# Patient Record
Sex: Female | Born: 1950 | Race: Black or African American | Hispanic: No | Smoking: Former smoker
Health system: Southern US, Community
[De-identification: ages and names within clinical notes are randomized; demographics above are authoritative.]

## PROBLEM LIST (undated history)

## (undated) DIAGNOSIS — E785 Hyperlipidemia, unspecified: Secondary | ICD-10-CM

## (undated) DIAGNOSIS — H409 Unspecified glaucoma: Secondary | ICD-10-CM

## (undated) DIAGNOSIS — I10 Essential (primary) hypertension: Secondary | ICD-10-CM

## (undated) HISTORY — DX: Essential (primary) hypertension: I10

## (undated) HISTORY — DX: Hyperlipidemia, unspecified: E78.5

## (undated) HISTORY — DX: Unspecified glaucoma: H40.9

## (undated) HISTORY — PX: ABDOMINAL HYSTERECTOMY: SHX81

---

## 1999-07-17 ENCOUNTER — Other Ambulatory Visit: Admission: RE | Admit: 1999-07-17 | Discharge: 1999-07-17 | Payer: Self-pay | Admitting: Obstetrics and Gynecology

## 2000-07-01 ENCOUNTER — Other Ambulatory Visit: Admission: RE | Admit: 2000-07-01 | Discharge: 2000-07-01 | Payer: Self-pay | Admitting: Obstetrics and Gynecology

## 2000-07-04 ENCOUNTER — Encounter: Payer: Self-pay | Admitting: Obstetrics and Gynecology

## 2000-07-04 ENCOUNTER — Encounter: Admission: RE | Admit: 2000-07-04 | Discharge: 2000-07-04 | Payer: Self-pay | Admitting: Obstetrics and Gynecology

## 2001-07-07 ENCOUNTER — Encounter: Payer: Self-pay | Admitting: Obstetrics and Gynecology

## 2001-07-07 ENCOUNTER — Encounter: Admission: RE | Admit: 2001-07-07 | Discharge: 2001-07-07 | Payer: Self-pay | Admitting: Obstetrics and Gynecology

## 2001-07-16 ENCOUNTER — Other Ambulatory Visit: Admission: RE | Admit: 2001-07-16 | Discharge: 2001-07-16 | Payer: Self-pay | Admitting: Internal Medicine

## 2002-07-09 ENCOUNTER — Encounter: Admission: RE | Admit: 2002-07-09 | Discharge: 2002-07-09 | Payer: Self-pay | Admitting: Obstetrics and Gynecology

## 2002-07-09 ENCOUNTER — Encounter: Payer: Self-pay | Admitting: Obstetrics and Gynecology

## 2002-07-14 ENCOUNTER — Other Ambulatory Visit: Admission: RE | Admit: 2002-07-14 | Discharge: 2002-07-14 | Payer: Self-pay | Admitting: Obstetrics and Gynecology

## 2002-07-24 ENCOUNTER — Encounter: Admission: RE | Admit: 2002-07-24 | Discharge: 2002-07-24 | Payer: Self-pay | Admitting: Obstetrics and Gynecology

## 2002-07-24 ENCOUNTER — Encounter: Payer: Self-pay | Admitting: Obstetrics and Gynecology

## 2003-11-22 ENCOUNTER — Encounter: Admission: RE | Admit: 2003-11-22 | Discharge: 2003-11-22 | Payer: Self-pay | Admitting: Obstetrics and Gynecology

## 2004-11-19 HISTORY — PX: COLONOSCOPY: SHX174

## 2004-11-23 ENCOUNTER — Ambulatory Visit (HOSPITAL_COMMUNITY): Admission: RE | Admit: 2004-11-23 | Discharge: 2004-11-23 | Payer: Self-pay | Admitting: Obstetrics and Gynecology

## 2005-01-01 ENCOUNTER — Other Ambulatory Visit: Admission: RE | Admit: 2005-01-01 | Discharge: 2005-01-01 | Payer: Self-pay | Admitting: Obstetrics and Gynecology

## 2005-03-07 ENCOUNTER — Ambulatory Visit (HOSPITAL_COMMUNITY): Admission: RE | Admit: 2005-03-07 | Discharge: 2005-03-07 | Payer: Self-pay | Admitting: Internal Medicine

## 2005-03-07 ENCOUNTER — Ambulatory Visit: Payer: Self-pay | Admitting: Internal Medicine

## 2005-06-19 ENCOUNTER — Emergency Department (HOSPITAL_COMMUNITY): Admission: EM | Admit: 2005-06-19 | Discharge: 2005-06-19 | Payer: Self-pay | Admitting: Emergency Medicine

## 2005-11-27 ENCOUNTER — Ambulatory Visit (HOSPITAL_COMMUNITY): Admission: RE | Admit: 2005-11-27 | Discharge: 2005-11-27 | Payer: Self-pay | Admitting: Obstetrics and Gynecology

## 2006-11-28 ENCOUNTER — Ambulatory Visit (HOSPITAL_COMMUNITY): Admission: RE | Admit: 2006-11-28 | Discharge: 2006-11-28 | Payer: Self-pay | Admitting: Obstetrics and Gynecology

## 2007-12-01 ENCOUNTER — Ambulatory Visit (HOSPITAL_COMMUNITY): Admission: RE | Admit: 2007-12-01 | Discharge: 2007-12-01 | Payer: Self-pay | Admitting: Obstetrics and Gynecology

## 2008-12-14 ENCOUNTER — Ambulatory Visit (HOSPITAL_COMMUNITY): Admission: RE | Admit: 2008-12-14 | Discharge: 2008-12-14 | Payer: Self-pay | Admitting: Obstetrics and Gynecology

## 2009-12-27 ENCOUNTER — Ambulatory Visit (HOSPITAL_COMMUNITY): Admission: RE | Admit: 2009-12-27 | Discharge: 2009-12-27 | Payer: Self-pay | Admitting: Obstetrics and Gynecology

## 2010-12-08 ENCOUNTER — Other Ambulatory Visit (HOSPITAL_COMMUNITY): Payer: Self-pay | Admitting: Obstetrics and Gynecology

## 2010-12-08 DIAGNOSIS — Z1239 Encounter for other screening for malignant neoplasm of breast: Secondary | ICD-10-CM

## 2010-12-29 ENCOUNTER — Other Ambulatory Visit (HOSPITAL_BASED_OUTPATIENT_CLINIC_OR_DEPARTMENT_OTHER): Payer: Self-pay | Admitting: Family Medicine

## 2010-12-29 ENCOUNTER — Ambulatory Visit (HOSPITAL_COMMUNITY)
Admission: RE | Admit: 2010-12-29 | Discharge: 2010-12-29 | Disposition: A | Discharge status: Home / Self Care | Payer: PRIVATE HEALTH INSURANCE | Source: Ambulatory Visit | Attending: Obstetrics and Gynecology | Admitting: Obstetrics and Gynecology

## 2010-12-29 DIAGNOSIS — Z139 Encounter for screening, unspecified: Secondary | ICD-10-CM

## 2010-12-29 DIAGNOSIS — Z1239 Encounter for other screening for malignant neoplasm of breast: Secondary | ICD-10-CM

## 2011-01-05 ENCOUNTER — Ambulatory Visit (HOSPITAL_COMMUNITY)
Admission: RE | Admit: 2011-01-05 | Discharge: 2011-01-05 | Disposition: A | Discharge status: Home / Self Care | Payer: PRIVATE HEALTH INSURANCE | Source: Ambulatory Visit | Attending: Family Medicine | Admitting: Family Medicine

## 2011-01-05 DIAGNOSIS — Z139 Encounter for screening, unspecified: Secondary | ICD-10-CM

## 2011-01-05 DIAGNOSIS — Z1231 Encounter for screening mammogram for malignant neoplasm of breast: Secondary | ICD-10-CM | POA: Insufficient documentation

## 2011-04-06 NOTE — Op Note (Signed)
NAMECLEMENCE, Alicia Wu                 ACCOUNT NO.:  [id number]   MEDICAL RECORD NO.:  [id number]          PATIENT TYPE:  AMB   LOCATION:  DAY                           FACILITY:  [facility]   PHYSICIAN:  R. Roetta Sessions, M.D. DATE OF BIRTH:  10-25-1951   DATE OF PROCEDURE:  03/07/2005  DATE OF DISCHARGE:                                 OPERATIVE REPORT   PROCEDURE:  Screening colonoscopy.   INDICATIONS FOR PROCEDURE:  The patient is a 60 year old sent over at the  courtesy of Dr. Lilyan Punt for colorectal cancer screening. She has no  lower GI tract symptoms. There is no family history of colorectal neoplasia.  She has never had a colonoscopy. Colonoscopy is now being done as a standard  screening maneuver. This approach has been discussed with the patient.  Potential risks, benefits, and alternatives have been reviewed and questions  answered. She is agreeable. Please see documentation in medical record.   PROCEDURE NOTE:  O2 saturation, blood pressure, pulse, and respirations were  monitored throughout the entire procedure. Conscious sedation with Versed 3  mg IV and Demerol 50 mg IV in divided doses.   INSTRUMENT:  Olympus video chip system.   FINDINGS:  Digital exam revealed no abnormalities.   ENDOSCOPIC FINDINGS:  Prep was good.   Rectum:  Examination of the rectal mucosa including retroflexed view of the  anal verge revealed no abnormalities.   Colon:  Colonic mucosa was surveyed from the rectosigmoid junction through  the left, transverse, and right colon to the area of the appendiceal  orifice, ileocecal valve, and cecum. These structures were well seen and  photographed for the record. Olympus videoscope was slowly withdrawn. All  previously mentioned mucosal surfaces were again seen. The colon mucosa  appeared normal. The patient tolerated the procedure well and was reactive  to endoscopy.   IMPRESSION:  Minimal internal hemorrhoids. Otherwise normal rectum, normal  colon.   RECOMMENDATIONS:  Repeat colonoscopy in 10 years.      RMR/MEDQ  D:  03/07/2005  T:  03/07/2005  Job:  [id number]   cc:   Lorin Picket A. Gerda Diss, MD  [address], [address]  [place]  [place] [address]  Fax: [phone]

## 2011-12-04 ENCOUNTER — Other Ambulatory Visit (HOSPITAL_COMMUNITY): Payer: Self-pay | Admitting: Obstetrics and Gynecology

## 2011-12-04 DIAGNOSIS — Z139 Encounter for screening, unspecified: Secondary | ICD-10-CM

## 2012-01-07 ENCOUNTER — Ambulatory Visit (HOSPITAL_COMMUNITY)
Admission: RE | Admit: 2012-01-07 | Discharge: 2012-01-07 | Disposition: A | Discharge status: Home / Self Care | Payer: PRIVATE HEALTH INSURANCE | Source: Ambulatory Visit | Attending: Obstetrics and Gynecology | Admitting: Obstetrics and Gynecology

## 2012-01-07 DIAGNOSIS — Z1231 Encounter for screening mammogram for malignant neoplasm of breast: Secondary | ICD-10-CM | POA: Insufficient documentation

## 2012-01-07 DIAGNOSIS — Z139 Encounter for screening, unspecified: Secondary | ICD-10-CM

## 2013-04-15 ENCOUNTER — Encounter: Payer: Self-pay | Admitting: *Deleted

## 2013-04-21 ENCOUNTER — Other Ambulatory Visit: Payer: Self-pay | Admitting: Family Medicine

## 2013-04-21 ENCOUNTER — Ambulatory Visit (INDEPENDENT_AMBULATORY_CARE_PROVIDER_SITE_OTHER): Payer: BC Managed Care – PPO | Admitting: Family Medicine

## 2013-04-21 ENCOUNTER — Encounter: Payer: Self-pay | Admitting: Family Medicine

## 2013-04-21 VITALS — BP 140/88 | HR 80 | Wt 204.0 lb

## 2013-04-21 DIAGNOSIS — Z139 Encounter for screening, unspecified: Secondary | ICD-10-CM

## 2013-04-21 DIAGNOSIS — E785 Hyperlipidemia, unspecified: Secondary | ICD-10-CM

## 2013-04-21 DIAGNOSIS — I10 Essential (primary) hypertension: Secondary | ICD-10-CM

## 2013-04-21 MED ORDER — AMLODIPINE BESYLATE 5 MG PO TABS: 5.0000 mg | ORAL_TABLET | Freq: Every day | ORAL | Status: DC

## 2013-04-21 NOTE — Patient Instructions (Addendum)
Check your BP once a week,  Do your labs! And your mamogram.  Follow up here in 6 months  Do more walking and eat healthy!!

## 2013-04-21 NOTE — Progress Notes (Signed)
  Subjective:    Patient ID: Alicia Wu, female    DOB: Mar 05, 1951, 62 y.o.   MRN: [id number]  HPI pt here on 6 month f/u on hypertension. States BP is good, fluctuates off and on.   This patient today she is trying to exercise on regular basis but she relates over the past several months she has not in addition to this she does try to keep healthy she takes her medicine. She denies any chest pressure shortness of breath nausea vomiting diarrhea fever chills past medical history hypertension family history noncontributory social doesn't smoke   Review of Systems See above    Objective:   Physical Exam Blood pressure recheck 138/80 neck no masses lungs are clear no crackles heart is regular pulses normal extremities no edema skin is warm and dry       Assessment & Plan:  HTN-good control continue current medication. I do recommend the patient do some lab work. If all goes well with her health we need to see her back in 6 months time. Wellness exam yearly recommended.

## 2013-04-23 ENCOUNTER — Ambulatory Visit (HOSPITAL_COMMUNITY)
Admission: RE | Admit: 2013-04-23 | Discharge: 2013-04-23 | Disposition: A | Discharge status: Home / Self Care | Payer: BC Managed Care – PPO | Source: Ambulatory Visit | Attending: Family Medicine | Admitting: Family Medicine

## 2013-04-23 DIAGNOSIS — Z1231 Encounter for screening mammogram for malignant neoplasm of breast: Secondary | ICD-10-CM | POA: Insufficient documentation

## 2013-04-23 DIAGNOSIS — Z139 Encounter for screening, unspecified: Secondary | ICD-10-CM

## 2013-05-05 ENCOUNTER — Encounter: Payer: Self-pay | Admitting: Family Medicine

## 2013-05-05 LAB — BASIC METABOLIC PANEL
Calcium: 9.8 mg/dL (ref 8.4–10.5)
Chloride: 106 mEq/L (ref 96–112)
Creat: 1.07 mg/dL (ref 0.50–1.10)
Glucose, Bld: 91 mg/dL (ref 70–99)

## 2013-05-05 LAB — LIPID PANEL: Total CHOL/HDL Ratio: 3.5 Ratio

## 2013-05-06 ENCOUNTER — Telehealth: Payer: Self-pay | Admitting: Family Medicine

## 2013-05-06 NOTE — Telephone Encounter (Signed)
Sent patient a copy of the letter / encounter dos 05/05/13.. Mailed out 05/07/13 ° ° °

## 2013-05-20 ENCOUNTER — Telehealth: Payer: Self-pay | Admitting: Family Medicine

## 2013-05-20 NOTE — Telephone Encounter (Signed)
Enc date 05/05/13 - letter printed & mailed 05/21/13 ° °

## 2013-11-05 ENCOUNTER — Encounter: Payer: Self-pay | Admitting: Family Medicine

## 2013-11-05 ENCOUNTER — Ambulatory Visit (INDEPENDENT_AMBULATORY_CARE_PROVIDER_SITE_OTHER): Payer: BC Managed Care – PPO | Admitting: Family Medicine

## 2013-11-05 VITALS — BP 138/90 | Ht 66.5 in | Wt 205.6 lb

## 2013-11-05 DIAGNOSIS — E785 Hyperlipidemia, unspecified: Secondary | ICD-10-CM

## 2013-11-05 DIAGNOSIS — I10 Essential (primary) hypertension: Secondary | ICD-10-CM

## 2013-11-05 NOTE — Progress Notes (Signed)
   Subjective:    Patient ID: Alicia Wu, female    DOB: [birth year], 62 y.o.   MRN: [id number]  HPI Patient is here today for a 6 month check up. She relates she's been exercising walking watching how she eats she denies any particular troubles currently  She has no concerns.  PMH benign, hyperlipidemia hypertension   Review of Systems Denies chest tightness pressure pain shortness breath nausea vomiting diarrhea or swelling in the legs.    Objective:   Physical Exam  Lungs are clear hearts regular pulse normal blood pressure borderline 138/92 extremities no edema      Assessment & Plan:  HTN subpar control patient does not want to go up on the dose of medicine I recommend that the patient work hard on diet exercise losing weight followup in 6 weeks if still not controlled we will adjust medicine she is to check her blood pressure as an outpatient as well plus do lab work before coming back in

## 2013-12-09 LAB — BASIC METABOLIC PANEL
BUN: 10 mg/dL (ref 6–23)
CO2: 27 mEq/L (ref 19–32)
Calcium: 9.3 mg/dL (ref 8.4–10.5)
Chloride: 106 mEq/L (ref 96–112)
Creat: 0.93 mg/dL (ref 0.50–1.10)
Glucose, Bld: 85 mg/dL (ref 70–99)
Potassium: 4.1 mEq/L (ref 3.5–5.3)
Sodium: 140 mEq/L (ref 135–145)

## 2013-12-09 LAB — LIPID PANEL
Cholesterol: 251 mg/dL — ABNORMAL HIGH (ref 0–200)
HDL: 63 mg/dL (ref >39)
LDL Cholesterol: 170 mg/dL — ABNORMAL HIGH (ref 0–99)
Total CHOL/HDL Ratio: 4 Ratio
Triglycerides: 90 mg/dL (ref <150)
VLDL: 18 mg/dL (ref 0–40)

## 2013-12-17 ENCOUNTER — Ambulatory Visit (INDEPENDENT_AMBULATORY_CARE_PROVIDER_SITE_OTHER): Payer: BC Managed Care – PPO | Admitting: Family Medicine

## 2013-12-17 ENCOUNTER — Encounter: Payer: Self-pay | Admitting: Family Medicine

## 2013-12-17 VITALS — BP 140/90 | Ht 65.5 in | Wt 206.2 lb

## 2013-12-17 DIAGNOSIS — I10 Essential (primary) hypertension: Secondary | ICD-10-CM

## 2013-12-17 DIAGNOSIS — E785 Hyperlipidemia, unspecified: Secondary | ICD-10-CM

## 2013-12-17 MED ORDER — PRAVASTATIN SODIUM 20 MG PO TABS: 20.0000 mg | ORAL_TABLET | Freq: Every evening | ORAL | Status: DC

## 2013-12-17 MED ORDER — AMLODIPINE BESYLATE 10 MG PO TABS: 10.0000 mg | ORAL_TABLET | Freq: Every day | ORAL | Status: DC

## 2013-12-17 NOTE — Progress Notes (Signed)
   Subjective:    Patient ID: Alicia Wu, female    DOB: June 22, 1951, 63 y.o.   MRN: [id number]  HPI  Patient arrives to follow up on blood pressure. She is very concerned about it when she checks her blood pressure with her monitor it is often elevated and she understands and that has not healthy. She relates she is trying to exercise more and try to watch how she eats she denies headaches with it. PMH HTN hyperlipidemia  Review of Systems She denies headaches chest pain shortness breath nausea vomiting swelling in the legs    Objective:   Physical Exam The lungs are clear hearts regular pulse normal extremities no edema skin warm dry neurologic grossly normal  Her blood pressure was taken with our wall unit as well as her automated unit in the seated position and her blood pressure on her unit was comparable to what ours was showing.       Assessment & Plan:  #1 HTN subpar control increase amlodipine 10 mg daily if swelling in the legs then it is necessary for her to let us know this. Obviously we'll not use lisinopril because of allergy. She will followup again in several months. She will send us some readings in a few weeks.  #2 hyperlipidemia-this puts her at risk of heart disease. I would recommend starting statin. All questions were answered. We will recheck lipid liver profile in 8 weeks' time follow her up again in somewhere between 3 and 6 months. Pravastatin 20 mg daily.

## 2013-12-24 ENCOUNTER — Other Ambulatory Visit: Payer: Self-pay | Admitting: Family Medicine

## 2014-03-10 LAB — LIPID PANEL
CHOL/HDL RATIO: 3.4 ratio
CHOLESTEROL: 208 mg/dL — AB (ref 0–200)
HDL: 62 mg/dL (ref >39)
LDL Cholesterol: 127 mg/dL — ABNORMAL HIGH (ref 0–99)
TRIGLYCERIDES: 94 mg/dL (ref <150)
VLDL: 19 mg/dL (ref 0–40)

## 2014-03-10 LAB — HEPATIC FUNCTION PANEL
ALBUMIN: 4.1 g/dL (ref 3.5–5.2)
ALK PHOS: 97 U/L (ref 39–117)
ALT: 29 U/L (ref 0–35)
AST: 27 U/L (ref 0–37)
BILIRUBIN INDIRECT: 0.4 mg/dL (ref 0.2–1.2)
BILIRUBIN TOTAL: 0.5 mg/dL (ref 0.2–1.2)
Bilirubin, Direct: 0.1 mg/dL (ref 0.0–0.3)
Total Protein: 7 g/dL (ref 6.0–8.3)

## 2014-03-17 ENCOUNTER — Encounter: Payer: Self-pay | Admitting: Family Medicine

## 2014-03-17 ENCOUNTER — Ambulatory Visit (INDEPENDENT_AMBULATORY_CARE_PROVIDER_SITE_OTHER): Payer: BC Managed Care – PPO | Admitting: Family Medicine

## 2014-03-17 ENCOUNTER — Other Ambulatory Visit: Payer: Self-pay | Admitting: Family Medicine

## 2014-03-17 VITALS — BP 150/80 | Ht 66.5 in | Wt 205.0 lb

## 2014-03-17 DIAGNOSIS — I10 Essential (primary) hypertension: Secondary | ICD-10-CM

## 2014-03-17 DIAGNOSIS — E785 Hyperlipidemia, unspecified: Secondary | ICD-10-CM

## 2014-03-17 DIAGNOSIS — Z1231 Encounter for screening mammogram for malignant neoplasm of breast: Secondary | ICD-10-CM

## 2014-03-17 MED ORDER — AMLODIPINE BESYLATE 10 MG PO TABS: 10.0000 mg | ORAL_TABLET | Freq: Every day | ORAL | Status: DC

## 2014-03-17 MED ORDER — PRAVASTATIN SODIUM 40 MG PO TABS: 40.0000 mg | ORAL_TABLET | Freq: Every evening | ORAL | Status: DC

## 2014-03-17 NOTE — Progress Notes (Signed)
   Subjective:    Patient ID: Alicia Wu, female    DOB: [birth year], 63 y.o.   MRN: [id number]  HPIMed check up. Follow up on bloodwork. Patient states no concerns today.  She relates compliance with her medicine not having any problem with the medicine no particular issues otherwise.   Review of Systems Denies chest tightness pressure pain shortness breath nausea vomiting diarrhea.    Objective:   Physical Exam Lungs clear heart regular pulse normal neck no masses extremities no edema skin warm dry blood pressure recheck twice looked good.       Assessment & Plan:  Hypertension decent control currently her blood pressures taken with the proper size cuff blood pressure much better. Followup if any ongoing troubles otherwise we'll see her back in approximately 4-6 months lab work at that time  We did go ahead and bump up the dose of her pravastatin try to get LDL closer to 100 recheck lab work again in several months time if any problems with medicine she is to let us know

## 2014-03-17 NOTE — Patient Instructions (Signed)
DASH Diet  The DASH diet stands for "Dietary Approaches to Stop Hypertension." It is a healthy eating plan that has been shown to reduce high blood pressure (hypertension) in as little as 14 days, while also possibly providing other significant health benefits. These other health benefits include reducing the risk of breast cancer after menopause and reducing the risk of type 2 diabetes, heart disease, colon cancer, and stroke. Health benefits also include weight loss and slowing kidney failure in patients with chronic kidney disease.   DIET GUIDELINES  · Limit salt (sodium). Your diet should contain less than 1500 mg of sodium daily.  · Limit refined or processed carbohydrates. Your diet should include mostly whole grains. Desserts and added sugars should be used sparingly.  · Include small amounts of heart-healthy fats. These types of fats include nuts, oils, and tub margarine. Limit saturated and trans fats. These fats have been shown to be harmful in the body.  CHOOSING FOODS   The following food groups are based on a 2000 calorie diet. See your Registered Dietitian for individual calorie needs.  Grains and Grain Products (6 to 8 servings daily)  · Eat More Often: Whole-wheat bread, brown rice, whole-grain or wheat pasta, quinoa, popcorn without added fat or salt (air popped).  · Eat Less Often: White bread, white pasta, white rice, cornbread.  Vegetables (4 to 5 servings daily)  · Eat More Often: Fresh, frozen, and canned vegetables. Vegetables may be raw, steamed, roasted, or grilled with a minimal amount of fat.  · Eat Less Often/Avoid: Creamed or fried vegetables. Vegetables in a cheese sauce.  Fruit (4 to 5 servings daily)  · Eat More Often: All fresh, canned (in natural juice), or frozen fruits. Dried fruits without added sugar. One hundred percent fruit juice (½ cup [237 mL] daily).  · Eat Less Often: Dried fruits with added sugar. Canned fruit in light or heavy syrup.  Lean Meats, Fish, and Poultry (2  servings or less daily. One serving is 3 to 4 oz [85-114 g]).  · Eat More Often: Ninety percent or leaner ground beef, tenderloin, sirloin. Round cuts of beef, chicken breast, turkey breast. All fish. Grill, bake, or broil your meat. Nothing should be fried.  · Eat Less Often/Avoid: Fatty cuts of meat, turkey, or chicken leg, thigh, or wing. Fried cuts of meat or fish.  Dairy (2 to 3 servings)  · Eat More Often: Low-fat or fat-free milk, low-fat plain or light yogurt, reduced-fat or part-skim cheese.  · Eat Less Often/Avoid: Milk (whole, 2%). Whole milk yogurt. Full-fat cheeses.  Nuts, Seeds, and Legumes (4 to 5 servings per week)  · Eat More Often: All without added salt.  · Eat Less Often/Avoid: Salted nuts and seeds, canned beans with added salt.  Fats and Sweets (limited)  · Eat More Often: Vegetable oils, tub margarines without trans fats, sugar-free gelatin. Mayonnaise and salad dressings.  · Eat Less Often/Avoid: Coconut oils, palm oils, butter, stick margarine, cream, half and half, cookies, candy, pie.  FOR MORE INFORMATION  The Dash Diet Eating Plan: www.dashdiet.org  Document Released: 10/25/2011 Document Revised: 01/28/2012 Document Reviewed: 10/25/2011  ExitCare® Patient Information ©2014 [employer] LLC.

## 2014-04-27 ENCOUNTER — Ambulatory Visit (HOSPITAL_COMMUNITY)
Admission: RE | Admit: 2014-04-27 | Discharge: 2014-04-27 | Disposition: A | Discharge status: Home / Self Care | Payer: BC Managed Care – PPO | Source: Ambulatory Visit | Attending: Family Medicine | Admitting: Family Medicine

## 2014-04-27 DIAGNOSIS — Z1231 Encounter for screening mammogram for malignant neoplasm of breast: Secondary | ICD-10-CM

## 2014-09-07 ENCOUNTER — Ambulatory Visit (INDEPENDENT_AMBULATORY_CARE_PROVIDER_SITE_OTHER): Payer: BC Managed Care – PPO | Admitting: Family Medicine

## 2014-09-07 ENCOUNTER — Encounter: Payer: Self-pay | Admitting: Family Medicine

## 2014-09-07 VITALS — BP 148/92 | Ht 66.5 in | Wt 200.0 lb

## 2014-09-07 DIAGNOSIS — L03116 Cellulitis of left lower limb: Secondary | ICD-10-CM

## 2014-09-07 DIAGNOSIS — R2240 Localized swelling, mass and lump, unspecified lower limb: Secondary | ICD-10-CM

## 2014-09-07 LAB — D-DIMER, QUANTITATIVE: D-Dimer, Quant: 0.7 ug/mL-FEU — ABNORMAL HIGH (ref 0.00–0.48)

## 2014-09-07 MED ORDER — DOXYCYCLINE HYCLATE 100 MG PO CAPS: 100.0000 mg | ORAL_CAPSULE | Freq: Two times a day (BID) | ORAL | Status: DC

## 2014-09-07 NOTE — Progress Notes (Signed)
   Subjective:    Patient ID: Alicia Wu, female    DOB: 1951/07/01, 63 y.o.   MRN: [id number]  HPI Patient is here today d/t left, leg swelling.  Her leg started to itch on Saturday, then the swelling started on Sunday with clear drainage. Her foot is swollen as well.  She believes it is the amlodipine that is causing the swelling. She has been taking it since April after she was switched off of Lisinopril d/t swelling.    I told the patient I did not feel the amlodipine is causing the swelling because only one leg if it was blood pressure medicine and be both legs Review of Systems Denies shortness of breath chest pain cough she relates soreness in the leg    Objective:   Physical Exam Has increased fever in the leg along with tenderness in the area of cellulitis on the upper part of the leg the lower part of the leg does have some swelling the calf is nontender lungs clear heart regular blood pressure slightly elevated       Assessment & Plan:  HTN resume taking blood pressure medicine followup 2 weeks Cellulitis warm compresses antibiotics prescribed Check d-dimer. If positive will need ultrasound of the leg.

## 2014-09-07 NOTE — Addendum Note (Signed)
Addended by: Margaretha SheffieldBROWN, AUTUMN S on: 09/07/2014 06:15 PM   Modules accepted: Orders

## 2014-09-08 ENCOUNTER — Ambulatory Visit (HOSPITAL_COMMUNITY)
Admission: RE | Admit: 2014-09-08 | Discharge: 2014-09-08 | Disposition: A | Discharge status: Home / Self Care | Payer: BC Managed Care – PPO | Source: Ambulatory Visit | Attending: Family Medicine | Admitting: Family Medicine

## 2014-09-08 DIAGNOSIS — R7989 Other specified abnormal findings of blood chemistry: Secondary | ICD-10-CM | POA: Insufficient documentation

## 2014-09-08 DIAGNOSIS — R2240 Localized swelling, mass and lump, unspecified lower limb: Secondary | ICD-10-CM | POA: Diagnosis present

## 2014-09-15 ENCOUNTER — Other Ambulatory Visit (HOSPITAL_COMMUNITY): Payer: BC Managed Care – PPO

## 2014-09-16 ENCOUNTER — Ambulatory Visit: Payer: BC Managed Care – PPO | Admitting: Family Medicine

## 2014-09-22 ENCOUNTER — Encounter: Payer: Self-pay | Admitting: Family Medicine

## 2014-09-22 ENCOUNTER — Ambulatory Visit (INDEPENDENT_AMBULATORY_CARE_PROVIDER_SITE_OTHER): Payer: BC Managed Care – PPO | Admitting: Family Medicine

## 2014-09-22 VITALS — BP 134/86 | Ht 66.5 in | Wt 203.0 lb

## 2014-09-22 DIAGNOSIS — L03116 Cellulitis of left lower limb: Secondary | ICD-10-CM

## 2014-09-22 NOTE — Progress Notes (Signed)
   Subjective:    Patient ID: Alicia Wu, female    DOB: Oct 01, 1951, 63 y.o.   MRN: [id number]  HPIfollow up on leg pain and swelling. Finished antibiotic. No pain or swelling now.  No other concerns.   Patient took the antibiotic she states that tenderness redness went away swelling went away she's feeling much better now she is able to walk around denies fevers or pain  Review of Systems     Objective:   Physical Exam Both calves are nontender the cellulitis in the left leg is resolved swelling is gone down no tenderness or pain circumferences equal       Assessment & Plan:  Cellulitis and leg pain resolved I feel that that was the cause of her d-dimer ultrasound was negative she is totally asymptomatic if she has recurrence of symptoms she needs follow-up immediately I do not believe this patient needs a further ultrasound at this point

## 2014-10-23 ENCOUNTER — Other Ambulatory Visit: Payer: Self-pay | Admitting: Family Medicine

## 2014-12-27 ENCOUNTER — Other Ambulatory Visit: Payer: Self-pay | Admitting: Family Medicine

## 2015-01-26 ENCOUNTER — Other Ambulatory Visit: Payer: Self-pay | Admitting: Family Medicine

## 2015-02-02 ENCOUNTER — Encounter: Payer: Self-pay | Admitting: Family Medicine

## 2015-02-02 ENCOUNTER — Ambulatory Visit (INDEPENDENT_AMBULATORY_CARE_PROVIDER_SITE_OTHER): Payer: 59 | Admitting: Family Medicine

## 2015-02-02 ENCOUNTER — Telehealth: Payer: Self-pay

## 2015-02-02 VITALS — BP 142/86 | Ht 66.5 in | Wt 202.2 lb

## 2015-02-02 DIAGNOSIS — E785 Hyperlipidemia, unspecified: Secondary | ICD-10-CM | POA: Diagnosis not present

## 2015-02-02 DIAGNOSIS — I10 Essential (primary) hypertension: Secondary | ICD-10-CM | POA: Diagnosis not present

## 2015-02-02 MED ORDER — AMLODIPINE BESYLATE 10 MG PO TABS: ORAL_TABLET | ORAL | Status: DC

## 2015-02-02 MED ORDER — PRAVASTATIN SODIUM 40 MG PO TABS: 40.0000 mg | ORAL_TABLET | Freq: Every evening | ORAL | Status: DC

## 2015-02-02 NOTE — Patient Instructions (Addendum)
Apirin 81 mg one per day to lessen risk of stroke     Dear Patient,  It has been recommended to you that you have a colonoscopy. It is your responsibility to carry through with this recommendation.   Did you realize that colon cancer is the second leading cancer killer in the [place]. One in every 20 adults will get colon cancer. If all adults would go through the recommended screening for colon cancer (getting a colonoscopy), then there would be a 60% reduction in the number of people dying from colon cancer.  Colon cancer just doesn't come out of the blue. It starts off as a small polyp which over time grows into a cancer. A colonoscopy can prevent cancer and in many cases detected when it is at a very treatable phase. Small colon cancers can have cure rates of 95%. Advanced colon cancer, which often occurs in people who do not do their screenings, have cure rates less than 20%. The risk of colon cancer advances with age. Most adults should have regular colonoscopies every 10 years starting at age 40. This recommendation can vary depending on a person's medical history.  Health-care laws now allow for you to call the gastroenterologist office directly in order to set yourself up for this very important tests. Today we have recommended to you that you do this test. This test may save your life. Failure to do this test puts you at risk for premature death from colon cancer. Do the right thing and schedule this test now.  Here as a list of specialists we recommend in the surrounding area. When you call their office let them know that you are a patient of our practice in your interested in doing a screening colonoscopy. They should assist you without problems. You will need the following information when you called them: 1-name of which Dr. you see, 2-your insurance information, 3-a list of medications that you currently take, 4-any allergies you have to medications.  [facility]  gastroenterologist Dr. Augusto Gamble, Dr Dione Housekeeper gastroenterologist   [phone]  Dr.Najeeb [facility]   [phone]  [facility] gastroenterology (Dr. Sherle Poe and [place]) [phone]  [facility] gastroenterology (Dr. Philippa Sicks, Percell [place], [place]) [phone]  Each group of specialists has assured [place] that when you called them they will help you get your colonoscopy set up. Should you have problems please let us know. Be sure to call soon. Sincerely, Sherie Don, Dr Lubertha South, Dr.Scott Luking     DASH Eating Plan DASH stands for "Dietary Approaches to Stop Hypertension." The DASH eating plan is a healthy eating plan that has been shown to reduce high blood pressure (hypertension). Additional health benefits may include reducing the risk of type 2 diabetes mellitus, heart disease, and stroke. The DASH eating plan may also help with weight loss. WHAT DO I NEED TO KNOW ABOUT THE DASH EATING PLAN? For the DASH eating plan, you will follow these general guidelines:  Choose foods with a percent daily value for sodium of less than 5% (as listed on the food label).  Use salt-free seasonings or herbs instead of table salt or sea salt.  Check with your health care provider or pharmacist before using salt substitutes.  Eat lower-sodium products, often labeled as "lower sodium" or "no salt added."  Eat fresh foods.  Eat more vegetables, fruits, and low-fat dairy products.  Choose whole grains. Look for the word "whole" as  the first word in the ingredient list.  Choose fish and skinless chicken or [place] more often than red meat. Limit fish, poultry, and meat to 6 oz (170 g) each day.  Limit sweets, desserts, sugars, and sugary drinks.  Choose heart-healthy fats.  Limit cheese to 1 oz (28 g) per day.  Eat more home-cooked food and less restaurant, buffet, and fast  food.  Limit fried foods.  Cook foods using methods other than frying.  Limit canned vegetables. If you do use them, rinse them well to decrease the sodium.  When eating at a restaurant, ask that your food be prepared with less salt, or no salt if possible. WHAT FOODS CAN I EAT? Seek help from a dietitian for individual calorie needs. Grains Whole grain or whole wheat bread. Brown rice. Whole grain or whole wheat pasta. Quinoa, bulgur, and whole grain cereals. Low-sodium cereals. Corn or whole wheat flour tortillas. Whole grain cornbread. Whole grain crackers. Low-sodium crackers. Vegetables Fresh or frozen vegetables (raw, steamed, roasted, or grilled). Low-sodium or reduced-sodium tomato and vegetable juices. Low-sodium or reduced-sodium tomato sauce and paste. Low-sodium or reduced-sodium canned vegetables.  Fruits All fresh, canned (in natural juice), or frozen fruits. Meat and Other Protein Products Ground beef (85% or leaner), grass-fed beef, or beef trimmed of fat. Skinless chicken or [place]. Ground chicken or [place]key. Pork trimmed of fat. All fish and seafood. Eggs. Dried beans, peas, or lentils. Unsalted nuts and seeds. Unsalted canned beans. Dairy Low-fat dairy products, such as skim or 1% milk, 2% or reduced-fat cheeses, low-fat ricotta or cottage cheese, or plain low-fat yogurt. Low-sodium or reduced-sodium cheeses. Fats and Oils Tub margarines without trans fats. Light or reduced-fat mayonnaise and salad dressings (reduced sodium). Avocado. Safflower, olive, or canola oils. Natural peanut or almond butter. Other Unsalted popcorn and pretzels. The items listed above may not be a complete list of recommended foods or beverages. Contact your dietitian for more options. WHAT FOODS ARE NOT RECOMMENDED? Grains White bread. White pasta. White rice. Refined cornbread. Bagels and croissants. Crackers that contain trans fat. Vegetables Creamed or fried vegetables. Vegetables in a  cheese sauce. Regular canned vegetables. Regular canned tomato sauce and paste. Regular tomato and vegetable juices. Fruits Dried fruits. Canned fruit in light or heavy syrup. Fruit juice. Meat and Other Protein Products Fatty cuts of meat. Ribs, chicken wings, bacon, sausage, bologna, salami, chitterlings, fatback, hot dogs, bratwurst, and packaged luncheon meats. Salted nuts and seeds. Canned beans with salt. Dairy Whole or 2% milk, cream, half-and-half, and cream cheese. Whole-fat or sweetened yogurt. Full-fat cheeses or blue cheese. Nondairy creamers and whipped toppings. Processed cheese, cheese spreads, or cheese curds. Condiments Onion and garlic salt, seasoned salt, table salt, and sea salt. Canned and packaged gravies. Worcestershire sauce. Tartar sauce. Barbecue sauce. Teriyaki sauce. Soy sauce, including reduced sodium. Steak sauce. Fish sauce. Oyster sauce. Cocktail sauce. Horseradish. Ketchup and mustard. Meat flavorings and tenderizers. Bouillon cubes. Hot sauce. Tabasco sauce. Marinades. Taco seasonings. Relishes. Fats and Oils Butter, stick margarine, lard, shortening, ghee, and bacon fat. Coconut, palm kernel, or palm oils. Regular salad dressings. Other Pickles and olives. Salted popcorn and pretzels. The items listed above may not be a complete list of foods and beverages to avoid. Contact your dietitian for more information. WHERE CAN I FIND MORE INFORMATION? National Heart, Lung, and Blood Institute: [employer]Prom[employer].itwww.nhlbi.nih.gov/health/health-topics/topics/dash/ Document Released: 10/25/2011 Document Revised: 03/22/2014 Document Reviewed: 09/09/2013 [facility] LLCExitCare Patient Information 2015 [place]Care, [place]. This information is not intended to replace advice given to  you by your health care provider. Make sure you discuss any questions you have with your health care provider.  

## 2015-02-02 NOTE — Progress Notes (Signed)
   Subjective:    Patient ID: Alicia Wu, female    DOB: 1951/10/19, 64 y.o.   MRN: [id number]  Hypertension This is a chronic problem. The current episode started more than 1 year ago. Pertinent negatives include no chest pain. Risk factors for coronary artery disease include dyslipidemia and post-menopausal state. Treatments tried: norvasc. There are no compliance problems.   Hyperlipidemia This is a chronic problem. The current episode started more than 1 year ago. Recent lipid tests were reviewed and are normal. Factors aggravating her hyperlipidemia include fatty foods. Pertinent negatives include no chest pain. Current antihyperlipidemic treatment includes statins. The current treatment provides significant improvement of lipids. There are no compliance problems.  Risk factors for coronary artery disease include family history and hypertension.      Review of Systems  Constitutional: Negative for activity change, appetite change and fatigue.  HENT: Negative for congestion.   Respiratory: Negative for cough.   Cardiovascular: Negative for chest pain.  Gastrointestinal: Negative for abdominal pain.  Endocrine: Negative for polydipsia and polyphagia.  Neurological: Negative for weakness.  Psychiatric/Behavioral: Negative for confusion.       Objective:   Physical Exam  Constitutional: She appears well-nourished. No distress.  Cardiovascular: Normal rate, regular rhythm and normal heart sounds.   No murmur heard. Pulmonary/Chest: Effort normal and breath sounds normal. No respiratory distress.  Musculoskeletal: She exhibits no edema.  Lymphadenopathy:    She has no cervical adenopathy.  Neurological: She is alert. She exhibits normal muscle tone.  Psychiatric: Her behavior is normal.  Vitals reviewed.         Assessment & Plan:  HTN fair control. Continue current medications. Follow-up next week for nurse visit check blood pressure while I am here it should be shown to  me we may need to adjust medication or possibly add a medicine.  History hyperlipidemia she needs a check a lipid profile liver profile continue to medication  Because of her blood pressure check metabolic 7 await the results of this.

## 2015-02-02 NOTE — Telephone Encounter (Signed)
I spoke to pt. She is on my call list for June to schedule for July 2016. Said she was just seen at Dr. Fletcher AnonLuking's this AM. I have not received a referral yet.

## 2015-02-02 NOTE — Telephone Encounter (Signed)
Pt is calling to set up a TCS but she is wanting to wait until July because she wil be getting new insurance. Her call back number is  [phone]. Thanks

## 2015-02-03 ENCOUNTER — Encounter: Payer: Self-pay | Admitting: Family Medicine

## 2015-02-03 LAB — LIPID PANEL
CHOL/HDL RATIO: 2.4 ratio (ref 0.0–4.4)
CHOLESTEROL TOTAL: 196 mg/dL (ref 100–199)
HDL: 83 mg/dL (ref >39)
LDL Calculated: 103 mg/dL — ABNORMAL HIGH (ref 0–99)
Triglycerides: 52 mg/dL (ref 0–149)
VLDL Cholesterol Cal: 10 mg/dL (ref 5–40)

## 2015-02-03 LAB — BASIC METABOLIC PANEL
BUN/Creatinine Ratio: 14 (ref 11–26)
BUN: 15 mg/dL (ref 8–27)
CALCIUM: 10 mg/dL (ref 8.7–10.3)
CO2: 24 mmol/L (ref 18–29)
Chloride: 103 mmol/L (ref 97–108)
Creatinine, Ser: 1.09 mg/dL — ABNORMAL HIGH (ref 0.57–1.00)
GFR calc non Af Amer: 54 mL/min/{1.73_m2} — ABNORMAL LOW (ref >59)
GFR, EST AFRICAN AMERICAN: 62 mL/min/{1.73_m2} (ref >59)
Glucose: 94 mg/dL (ref 65–99)
POTASSIUM: 4 mmol/L (ref 3.5–5.2)
SODIUM: 143 mmol/L (ref 134–144)

## 2015-02-03 LAB — HEPATIC FUNCTION PANEL
ALT: 29 IU/L (ref 0–32)
AST: 32 IU/L (ref 0–40)
Albumin: 4.8 g/dL (ref 3.6–4.8)
Alkaline Phosphatase: 102 IU/L (ref 39–117)
Bilirubin Total: 0.5 mg/dL (ref 0.0–1.2)
Bilirubin, Direct: 0.15 mg/dL (ref 0.00–0.40)
Total Protein: 6.9 g/dL (ref 6.0–8.5)

## 2015-02-09 ENCOUNTER — Ambulatory Visit: Payer: 59

## 2015-02-09 ENCOUNTER — Telehealth: Payer: Self-pay | Admitting: Internal Medicine

## 2015-02-09 VITALS — BP 136/84

## 2015-02-09 DIAGNOSIS — Z0130 Encounter for examination of blood pressure without abnormal findings: Secondary | ICD-10-CM

## 2015-02-09 NOTE — Telephone Encounter (Signed)
RECALL FOR TCS °

## 2015-02-10 NOTE — Telephone Encounter (Signed)
Letter mailed to pt to call.  

## 2015-03-18 ENCOUNTER — Other Ambulatory Visit: Payer: Self-pay | Admitting: Family Medicine

## 2015-03-18 DIAGNOSIS — Z1231 Encounter for screening mammogram for malignant neoplasm of breast: Secondary | ICD-10-CM

## 2015-05-19 ENCOUNTER — Ambulatory Visit (HOSPITAL_COMMUNITY)
Admission: RE | Admit: 2015-05-19 | Discharge: 2015-05-19 | Disposition: A | Discharge status: Home / Self Care | Payer: Medicare Other | Source: Ambulatory Visit | Attending: Family Medicine | Admitting: Family Medicine

## 2015-05-19 DIAGNOSIS — Z1231 Encounter for screening mammogram for malignant neoplasm of breast: Secondary | ICD-10-CM | POA: Diagnosis present

## 2015-05-24 ENCOUNTER — Other Ambulatory Visit: Payer: Self-pay | Admitting: Family Medicine

## 2015-05-24 ENCOUNTER — Telehealth: Payer: Self-pay

## 2015-05-24 ENCOUNTER — Other Ambulatory Visit: Payer: Self-pay

## 2015-05-24 DIAGNOSIS — R928 Other abnormal and inconclusive findings on diagnostic imaging of breast: Secondary | ICD-10-CM

## 2015-05-24 DIAGNOSIS — Z1211 Encounter for screening for malignant neoplasm of colon: Secondary | ICD-10-CM

## 2015-05-25 NOTE — Telephone Encounter (Signed)
Gastroenterology Pre-Procedure Review  Request Date: 05/24/2015 Requesting Physician: Dr. Lilyan PuntScott Luking  PATIENT REVIEW QUESTIONS: The patient responded to the following health history questions as indicated:   PT'S LAST COLONOSCOPY WAS 03/07/2005 BY DR. Jena GaussOURK   1. Diabetes Melitis: no 2. Joint replacements in the past 12 months: no 3. Major health problems in the past 3 months: no 4. Has an artificial valve or MVP: no 5. Has a defibrillator: no 6. Has been advised in past to take antibiotics in advance of a procedure like teeth cleaning: no    MEDICATIONS & ALLERGIES:    Patient reports the following regarding taking any blood thinners:   Plavix? no Aspirin? YES Coumadin? no  Patient confirms/reports the following medications:  Current Outpatient Prescriptions  Medication Sig Dispense Refill  . amLODipine (NORVASC) 10 MG tablet TAKE 1 TABLET (10 MG TOTAL) BY MOUTH DAILY. 30 tablet 5  . aspirin 81 MG tablet Take 81 mg by mouth daily.    . Multiple Vitamins-Minerals (ONE-A-DAY 50 PLUS) TABS Take by mouth.    . pravastatin (PRAVACHOL) 40 MG tablet Take 1 tablet (40 mg total) by mouth every evening. 30 tablet 11  . travoprost, benzalkonium, (TRAVATAN) 0.004 % ophthalmic solution Place 1 drop into both eyes at bedtime.     No current facility-administered medications for this visit.    Patient confirms/reports the following allergies:  Allergies  Allergen Reactions  . Lisinopril Swelling  . Sulfa Antibiotics Hives    No orders of the defined types were placed in this encounter.    AUTHORIZATION INFORMATION Primary Insurance:   ID #: Group #:  Pre-Cert / Auth required: Pre-Cert / Auth #:   Secondary Insurance:   ID #:  Group #:  Pre-Cert / Auth required: Pre-Cert / Auth #:   SCHEDULE INFORMATION: Procedure has been scheduled as follows:  Date: 06/03/2015        Time: 11:30 AM  [facility] Short Stay  This Gastroenterology Pre-Precedure Review Form is  being routed to the following provider(s): R. Roetta SessionsMichael Rourk, MD

## 2015-05-25 NOTE — Telephone Encounter (Signed)
Appropriate.

## 2015-05-26 MED ORDER — PEG-KCL-NACL-NASULF-NA ASC-C 100 G PO SOLR: 1.0000 | ORAL | Status: DC

## 2015-05-26 NOTE — Telephone Encounter (Signed)
Rx sent to the pharmacy and instructions mailed to pt.  

## 2015-05-31 ENCOUNTER — Ambulatory Visit (HOSPITAL_COMMUNITY)
Admission: RE | Admit: 2015-05-31 | Discharge: 2015-05-31 | Disposition: A | Discharge status: Home / Self Care | Payer: Medicare Other | Source: Ambulatory Visit | Attending: Family Medicine | Admitting: Family Medicine

## 2015-05-31 DIAGNOSIS — R928 Other abnormal and inconclusive findings on diagnostic imaging of breast: Secondary | ICD-10-CM | POA: Insufficient documentation

## 2015-06-03 ENCOUNTER — Ambulatory Visit (HOSPITAL_COMMUNITY)
Admission: RE | Admit: 2015-06-03 | Discharge: 2015-06-03 | Disposition: A | Discharge status: Home / Self Care | Payer: Medicare Other | Source: Ambulatory Visit | Attending: Internal Medicine | Admitting: Internal Medicine

## 2015-06-03 ENCOUNTER — Encounter (HOSPITAL_COMMUNITY): Payer: Self-pay | Admitting: *Deleted

## 2015-06-03 ENCOUNTER — Encounter (HOSPITAL_COMMUNITY)
Admission: RE | Disposition: A | Discharge status: Home / Self Care | Payer: Self-pay | Source: Ambulatory Visit | Attending: Internal Medicine

## 2015-06-03 DIAGNOSIS — Z79899 Other long term (current) drug therapy: Secondary | ICD-10-CM | POA: Diagnosis not present

## 2015-06-03 DIAGNOSIS — Z7982 Long term (current) use of aspirin: Secondary | ICD-10-CM | POA: Insufficient documentation

## 2015-06-03 DIAGNOSIS — E785 Hyperlipidemia, unspecified: Secondary | ICD-10-CM | POA: Insufficient documentation

## 2015-06-03 DIAGNOSIS — I10 Essential (primary) hypertension: Secondary | ICD-10-CM | POA: Insufficient documentation

## 2015-06-03 DIAGNOSIS — Z1211 Encounter for screening for malignant neoplasm of colon: Secondary | ICD-10-CM | POA: Insufficient documentation

## 2015-06-03 DIAGNOSIS — H409 Unspecified glaucoma: Secondary | ICD-10-CM | POA: Diagnosis not present

## 2015-06-03 HISTORY — PX: COLONOSCOPY: SHX5424

## 2015-06-03 SURGERY — COLONOSCOPY
Anesthesia: Moderate Sedation

## 2015-06-03 MED ORDER — MEPERIDINE HCL 100 MG/ML IJ SOLN
INTRAMUSCULAR | Status: DC | PRN
  Administered 2015-06-03: 50 mg via INTRAVENOUS

## 2015-06-03 MED ORDER — SIMETHICONE 40 MG/0.6ML PO SUSP
ORAL | Status: AC
  Filled 2015-06-03: qty 0.6

## 2015-06-03 MED ORDER — ONDANSETRON HCL 4 MG/2ML IJ SOLN
INTRAMUSCULAR | Status: DC | PRN
  Administered 2015-06-03: 4 mg via INTRAVENOUS

## 2015-06-03 MED ORDER — SODIUM CHLORIDE 0.9 % IV SOLN
INTRAVENOUS | Status: DC
  Administered 2015-06-03: 13:00:00 via INTRAVENOUS

## 2015-06-03 MED ORDER — STERILE WATER FOR IRRIGATION IR SOLN
Status: DC | PRN
  Administered 2015-06-03: 14:00:00

## 2015-06-03 MED ORDER — ONDANSETRON HCL 4 MG/2ML IJ SOLN
INTRAMUSCULAR | Status: AC
  Filled 2015-06-03: qty 2

## 2015-06-03 MED ORDER — MIDAZOLAM HCL 5 MG/5ML IJ SOLN
INTRAMUSCULAR | Status: DC | PRN
  Administered 2015-06-03: 2 mg via INTRAVENOUS
  Administered 2015-06-03 (×2): 1 mg via INTRAVENOUS

## 2015-06-03 MED ORDER — MIDAZOLAM HCL 5 MG/5ML IJ SOLN
INTRAMUSCULAR | Status: AC
  Filled 2015-06-03: qty 10

## 2015-06-03 MED ORDER — MEPERIDINE HCL 100 MG/ML IJ SOLN
INTRAMUSCULAR | Status: AC
  Filled 2015-06-03: qty 2

## 2015-06-03 NOTE — H&P (Signed)
@LOGO @   Primary Care Physician:  Sallee Lange, MD Primary Gastroenterologist:  Dr. Gala Romney  Pre-Procedure History & Physical: HPI:  Alicia Wu is a 64 y.o. female is here for a screening colonoscopy. No bowel symptoms. No Family history of colon cancer. Reported negative colonoscopy 10 years ago-records unavailable.  Past Medical History  Diagnosis Date  . Hypertension   . Hyperlipidemia   . Glaucoma     Past Surgical History  Procedure Laterality Date  . Abdominal hysterectomy    . Colonoscopy  2006    Prior to Admission medications   Medication Sig Start Date End Date Taking? Authorizing Provider  amLODipine (NORVASC) 10 MG tablet TAKE 1 TABLET (10 MG TOTAL) BY MOUTH DAILY. 02/02/15  Yes Kathyrn Drown, MD  aspirin 81 MG tablet Take 81 mg by mouth daily.   Yes Historical Provider, MD  latanoprost (XALATAN) 0.005 % ophthalmic solution INSTILL 1 DROP INTO INTO BOTH EYES AT BEDTIME 04/07/15  Yes Historical Provider, MD  Multiple Vitamins-Minerals (ONE-A-DAY 50 PLUS) TABS Take by mouth.   Yes Historical Provider, MD  peg 3350 powder (MOVIPREP) 100 G SOLR Take 1 kit (200 g total) by mouth as directed. 05/26/15  Yes Daneil Dolin, MD  pravastatin (PRAVACHOL) 40 MG tablet Take 1 tablet (40 mg total) by mouth every evening. 02/02/15  Yes Kathyrn Drown, MD    Allergies as of 05/24/2015 - Review Complete 05/24/2015  Allergen Reaction Noted  . Lisinopril Swelling 04/15/2013  . Sulfa antibiotics Hives 05/24/2015    History reviewed. No pertinent family history.  History   Social History  . Marital Status: Widowed    Spouse Name: N/A  . Number of Children: N/A  . Years of Education: N/A   Occupational History  . Not on file.   Social History Main Topics  . Smoking status: Never Smoker   . Smokeless tobacco: Not on file  . Alcohol Use: No  . Drug Use: No  . Sexual Activity: No   Other Topics Concern  . Not on file   Social History Narrative    Review of  Systems: See HPI, otherwise negative ROS  Physical Exam: BP 133/83 mmHg  Pulse 71  Temp(Src) 98.7 F (37.1 C)  Resp 18  Ht 5' 6.5" (1.689 m)  Wt 199 lb (90.266 kg)  BMI 31.64 kg/m2  SpO2 99% General:   Alert,  Well-developed, well-nourished, pleasant and cooperative in NAD Head:  Normocephalic and atraumatic. Eyes:  Sclera clear, no icterus.   Conjunctiva pink. Ears:  Normal auditory acuity. Nose:  No deformity, discharge,  or lesions. Mouth:  No deformity or lesions, dentition normal. Neck:  Supple; no masses or thyromegaly. Lungs:  Clear throughout to auscultation.   No wheezes, crackles, or rhonchi. No acute distress. Heart:  Regular rate and rhythm; no murmurs, clicks, rubs,  or gallops. Abdomen:  Soft, nontender and nondistended. No masses, hepatosplenomegaly or hernias noted. Normal bowel sounds, without guarding, and without rebound.   Msk:  Symmetrical without gross deformities. Normal posture. Pulses:  Normal pulses noted. Extremities:  Without clubbing or edema. Impression/Plan: Alicia Wu is now here to undergo a screening colonoscopy.  Average risk screening examination.  Risks, benefits, limitations, imponderables and alternatives regarding colonoscopy have been reviewed with the patient. Questions have been answered. All parties agreeable.     Notice:  This dictation was prepared with Dragon dictation along with smaller phrase technology. Any transcriptional errors that result from this process are unintentional and  may not be corrected upon review.

## 2015-06-03 NOTE — Discharge Instructions (Signed)
°  Colonoscopy Discharge Instructions  Read the instructions outlined below and refer to this sheet in the next few weeks. These discharge instructions provide you with general information on caring for yourself after you leave the hospital. Your doctor may also give you specific instructions. While your treatment has been planned according to the most current medical practices available, unavoidable complications occasionally occur. If you have any problems or questions after discharge, call Dr. Jena Gaussourk at [phone]. ACTIVITY  You may resume your regular activity, but move at a slower pace for the next 24 hours.   Take frequent rest periods for the next 24 hours.   Walking will help get rid of the air and reduce the bloated feeling in your belly (abdomen).   No driving for 24 hours (because of the medicine (anesthesia) used during the test).    Do not sign any important legal documents or operate any machinery for 24 hours (because of the anesthesia used during the test).  NUTRITION  Drink plenty of fluids.   You may resume your normal diet as instructed by your doctor.   Begin with a light meal and progress to your normal diet. Heavy or fried foods are harder to digest and may make you feel sick to your stomach (nauseated).   Avoid alcoholic beverages for 24 hours or as instructed.  MEDICATIONS  You may resume your normal medications unless your doctor tells you otherwise.  WHAT YOU CAN EXPECT TODAY  Some feelings of bloating in the abdomen.   Passage of more gas than usual.   Spotting of blood in your stool or on the toilet paper.  IF YOU HAD POLYPS REMOVED DURING THE COLONOSCOPY:  No aspirin products for 7 days or as instructed.   No alcohol for 7 days or as instructed.   Eat a soft diet for the next 24 hours.  FINDING OUT THE RESULTS OF YOUR TEST Not all test results are available during your visit. If your test results are not back during the visit, make an appointment  with your caregiver to find out the results. Do not assume everything is normal if you have not heard from your caregiver or the medical facility. It is important for you to follow up on all of your test results.  SEEK IMMEDIATE MEDICAL ATTENTION IF:  You have more than a spotting of blood in your stool.   Your belly is swollen (abdominal distention).   You are nauseated or vomiting.   You have a temperature over 101.   You have abdominal pain or discomfort that is severe or gets worse throughout the day.    Your colonoscopy was normal today.  I recommend you return in 10 years for 1 more screening colonoscopy

## 2015-06-03 NOTE — Op Note (Signed)
[facility] [address] [place], [id number]   COLONOSCOPY PROCEDURE REPORT  PATIENT: Alicia Wu, [name]  MR#: [id number] BIRTHDATE: [birth year] , 64  yrs. old GENDER: female ENDOSCOPIST: R.  Roetta SessionsMichael Lakeisha Waldrop, MD FACP [facility] REFERRED XB:JYNWGBY:Scott Gerda DissLuking, M.D. PROCEDURE DATE:  06/03/2015 PROCEDURE:   Colonoscopy, screening INDICATIONS:Average risk colorectal cancer screening examination. MEDICATIONS: Versed 4 mg IV and Demerol 50 mg IV in divided doses. Zofran 4 mg IV. ASA CLASS:       Class II  CONSENT: The risks, benefits, alternatives and imponderables including but not limited to bleeding, perforation as well as the possibility of a missed lesion have been reviewed.  The potential for biopsy, lesion removal, etc. have also been discussed. Questions have been answered.  All parties agreeable.  Please see the history and physical in the medical record for more information.  DESCRIPTION OF PROCEDURE:   After the risks benefits and alternatives of the procedure were thoroughly explained, informed consent was obtained.  The digital rectal exam was normal. The EC-3890Li ([id number]  endoscope was introduced through the anus and advanced to the cecum, which was identified by both the appendix and ileocecal valve. No adverse events experienced.   The quality of the prep was adequate  The instrument was then slowly withdrawn as the colon was fully examined. Estimated blood loss is zero unless otherwise noted in this procedure report.      COLON FINDINGS: Normal-appearing rectal mucosa.  Somewhat of a redundant colon; however, the colonic mucosa appeared normal. Retroflexion was performed. .  Withdrawal time=8 minutes 0 seconds.  The scope was withdrawn and the procedure completed. COMPLICATIONS: There were no immediate complications.  ENDOSCOPIC IMPRESSION: Normal colonoscopy  RECOMMENDATIONS: One more colonoscopy in 10 years for screening purposes  eSigned:  R.  Roetta SessionsMichael Ameera Tigue, MD Jerrel IvoryFACP [facility] 06/03/2015 2:47 PM   cc:  CPT CODES: ICD CODES:  The ICD and CPT codes recommended by this software are interpretations from the data that the clinical staff has captured with the software.  The verification of the translation of this report to the ICD and CPT codes and modifiers is the sole responsibility of the health care institution and practicing physician where this report was generated.  [employer] will not be held responsible for the validity of the ICD and CPT codes included on this report.  AMA assumes no liability for data contained or not contained herein. CPT is a Publishing rights managerregistered trademark of the [employer].

## 2015-06-06 ENCOUNTER — Encounter (HOSPITAL_COMMUNITY): Payer: Self-pay | Admitting: Internal Medicine

## 2015-06-27 ENCOUNTER — Other Ambulatory Visit: Payer: Self-pay | Admitting: *Deleted

## 2015-06-27 MED ORDER — PRAVASTATIN SODIUM 40 MG PO TABS: 40.0000 mg | ORAL_TABLET | Freq: Every evening | ORAL | Status: DC

## 2015-08-03 ENCOUNTER — Ambulatory Visit (INDEPENDENT_AMBULATORY_CARE_PROVIDER_SITE_OTHER): Payer: Medicare Other | Admitting: Family Medicine

## 2015-08-03 ENCOUNTER — Encounter: Payer: Self-pay | Admitting: Family Medicine

## 2015-08-03 VITALS — BP 132/86 | Ht 66.5 in | Wt 201.0 lb

## 2015-08-03 DIAGNOSIS — E785 Hyperlipidemia, unspecified: Secondary | ICD-10-CM | POA: Diagnosis not present

## 2015-08-03 DIAGNOSIS — I10 Essential (primary) hypertension: Secondary | ICD-10-CM

## 2015-08-03 NOTE — Patient Instructions (Signed)
DASH Eating Plan °DASH stands for "Dietary Approaches to Stop Hypertension." The DASH eating plan is a healthy eating plan that has been shown to reduce high blood pressure (hypertension). Additional health benefits may include reducing the risk of type 2 diabetes mellitus, heart disease, and stroke. The DASH eating plan may also help with weight loss. °WHAT DO I NEED TO KNOW ABOUT THE DASH EATING PLAN? °For the DASH eating plan, you will follow these general guidelines: °· Choose foods with a percent daily value for sodium of less than 5% (as listed on the food label). °· Use salt-free seasonings or herbs instead of table salt or sea salt. °· Check with your health care provider or pharmacist before using salt substitutes. °· Eat lower-sodium products, often labeled as "lower sodium" or "no salt added." °· Eat fresh foods. °· Eat more vegetables, fruits, and low-fat dairy products. °· Choose whole grains. Look for the word "whole" as the first word in the ingredient list. °· Choose fish and skinless chicken or turkey more often than red meat. Limit fish, poultry, and meat to 6 oz (170 g) each day. °· Limit sweets, desserts, sugars, and sugary drinks. °· Choose heart-healthy fats. °· Limit cheese to 1 oz (28 g) per day. °· Eat more home-cooked food and less restaurant, buffet, and fast food. °· Limit fried foods. °· Cook foods using methods other than frying. °· Limit canned vegetables. If you do use them, rinse them well to decrease the sodium. °· When eating at a restaurant, ask that your food be prepared with less salt, or no salt if possible. °WHAT FOODS CAN I EAT? °Seek help from a dietitian for individual calorie needs. °Grains °Whole grain or whole wheat bread. Brown rice. Whole grain or whole wheat pasta. Quinoa, bulgur, and whole grain cereals. Low-sodium cereals. Corn or whole wheat flour tortillas. Whole grain cornbread. Whole grain crackers. Low-sodium crackers. °Vegetables °Fresh or frozen vegetables  (raw, steamed, roasted, or grilled). Low-sodium or reduced-sodium tomato and vegetable juices. Low-sodium or reduced-sodium tomato sauce and paste. Low-sodium or reduced-sodium canned vegetables.  °Fruits °All fresh, canned (in natural juice), or frozen fruits. °Meat and Other Protein Products °Ground beef (85% or leaner), grass-fed beef, or beef trimmed of fat. Skinless chicken or turkey. Ground chicken or turkey. Pork trimmed of fat. All fish and seafood. Eggs. Dried beans, peas, or lentils. Unsalted nuts and seeds. Unsalted canned beans. °Dairy °Low-fat dairy products, such as skim or 1% milk, 2% or reduced-fat cheeses, low-fat ricotta or cottage cheese, or plain low-fat yogurt. Low-sodium or reduced-sodium cheeses. °Fats and Oils °Tub margarines without trans fats. Light or reduced-fat mayonnaise and salad dressings (reduced sodium). Avocado. Safflower, olive, or canola oils. Natural peanut or almond butter. °Other °Unsalted popcorn and pretzels. °The items listed above may not be a complete list of recommended foods or beverages. Contact your dietitian for more options. °WHAT FOODS ARE NOT RECOMMENDED? °Grains °White bread. White pasta. White rice. Refined cornbread. Bagels and croissants. Crackers that contain trans fat. °Vegetables °Creamed or fried vegetables. Vegetables in a cheese sauce. Regular canned vegetables. Regular canned tomato sauce and paste. Regular tomato and vegetable juices. °Fruits °Dried fruits. Canned fruit in light or heavy syrup. Fruit juice. °Meat and Other Protein Products °Fatty cuts of meat. Ribs, chicken wings, bacon, sausage, bologna, salami, chitterlings, fatback, hot dogs, bratwurst, and packaged luncheon meats. Salted nuts and seeds. Canned beans with salt. °Dairy °Whole or 2% milk, cream, half-and-half, and cream cheese. Whole-fat or sweetened yogurt. Full-fat   cheeses or blue cheese. Nondairy creamers and whipped toppings. Processed cheese, cheese spreads, or cheese  curds. °Condiments °Onion and garlic salt, seasoned salt, table salt, and sea salt. Canned and packaged gravies. Worcestershire sauce. Tartar sauce. Barbecue sauce. Teriyaki sauce. Soy sauce, including reduced sodium. Steak sauce. Fish sauce. Oyster sauce. Cocktail sauce. Horseradish. Ketchup and mustard. Meat flavorings and tenderizers. Bouillon cubes. Hot sauce. Tabasco sauce. Marinades. Taco seasonings. Relishes. °Fats and Oils °Butter, stick margarine, lard, shortening, ghee, and bacon fat. Coconut, palm kernel, or palm oils. Regular salad dressings. °Other °Pickles and olives. Salted popcorn and pretzels. °The items listed above may not be a complete list of foods and beverages to avoid. Contact your dietitian for more information. °WHERE CAN I FIND MORE INFORMATION? °National Heart, Lung, and Blood Institute: www.nhlbi.nih.gov/health/health-topics/topics/dash/ °Document Released: 10/25/2011 Document Revised: 03/22/2014 Document Reviewed: 09/09/2013 °ExitCare® Patient Information ©2015 [employer]. This information is not intended to replace advice given to you by your health care provider. Make sure you discuss any questions you have with your health care provider. ° °

## 2015-08-03 NOTE — Progress Notes (Signed)
   Subjective:    Patient ID: Alicia Wu, female    DOB: [birth year], 64 y.o.   MRN: [id number]  Hypertension This is a chronic problem. The current episode started more than 1 year ago. Pertinent negatives include no chest pain. There are no compliance problems (exercises 5 -6 days a week ).    Pt states no concerns today.   Pt declines flu vaccine.   she relates she has not been exercising as much as she used to she also states she tries to eat healthy but she does eat a lot of popcorn. She will try to back down.  Review of Systems  Constitutional: Negative for activity change, appetite change and fatigue.  HENT: Negative for congestion.   Respiratory: Negative for cough.   Cardiovascular: Negative for chest pain.  Gastrointestinal: Negative for abdominal pain.  Endocrine: Negative for polydipsia and polyphagia.  Neurological: Negative for weakness.  Psychiatric/Behavioral: Negative for confusion.       Objective:   Physical Exam  Constitutional: She appears well-nourished. No distress.  Cardiovascular: Normal rate, regular rhythm and normal heart sounds.   No murmur heard. Pulmonary/Chest: Effort normal and breath sounds normal. No respiratory distress.  Musculoskeletal: She exhibits no edema.  Lymphadenopathy:    She has no cervical adenopathy.  Neurological: She is alert. She exhibits normal muscle tone.  Psychiatric: Her behavior is normal.  Vitals reviewed.         Assessment & Plan:   blood pressure is elevated above what I would expect to see the patient needs to get back into walking on regular basis watch her diet closely do breast try to bring her blood pressure I recommend she follow-up again in 4 weeks time if her blood pressure is not improved we will need to add probably diuretic follow-up sooner problems

## 2015-08-09 ENCOUNTER — Other Ambulatory Visit: Payer: Self-pay | Admitting: Family Medicine

## 2015-08-16 DIAGNOSIS — Z23 Encounter for immunization: Secondary | ICD-10-CM | POA: Diagnosis not present

## 2015-08-26 DIAGNOSIS — H401131 Primary open-angle glaucoma, bilateral, mild stage: Secondary | ICD-10-CM | POA: Diagnosis not present

## 2015-09-02 ENCOUNTER — Encounter: Payer: Self-pay | Admitting: Family Medicine

## 2015-09-02 ENCOUNTER — Ambulatory Visit (INDEPENDENT_AMBULATORY_CARE_PROVIDER_SITE_OTHER): Payer: Medicare Other | Admitting: Family Medicine

## 2015-09-02 VITALS — BP 128/88 | Ht 66.5 in | Wt 201.0 lb

## 2015-09-02 DIAGNOSIS — Z780 Asymptomatic menopausal state: Secondary | ICD-10-CM

## 2015-09-02 DIAGNOSIS — Z23 Encounter for immunization: Secondary | ICD-10-CM | POA: Diagnosis not present

## 2015-09-02 DIAGNOSIS — M8580 Other specified disorders of bone density and structure, unspecified site: Secondary | ICD-10-CM | POA: Diagnosis not present

## 2015-09-02 DIAGNOSIS — I10 Essential (primary) hypertension: Secondary | ICD-10-CM | POA: Diagnosis not present

## 2015-09-02 MED ORDER — AMLODIPINE BESYLATE 10 MG PO TABS: ORAL_TABLET | ORAL | Status: DC

## 2015-09-02 MED ORDER — PRAVASTATIN SODIUM 40 MG PO TABS: 40.0000 mg | ORAL_TABLET | Freq: Every evening | ORAL | Status: DC

## 2015-09-02 NOTE — Progress Notes (Signed)
   Subjective:    Patient ID: Alicia Wu, female    DOB: 04/22/1951, 64 y.o.   MRN: [id number]  Hypertension This is a chronic problem. Pertinent negatives include no chest pain.  taking norvasc 10mg  every day.  walking every day except when it rains. Eats healthy most of the time.  Pt states no other concerns today.  Patient states she seems to be doing good watching her diet minimizing salt denies any chest tightness or shortness of breath energy level good takes medicine without problems   Review of Systems  Constitutional: Negative for activity change, appetite change and fatigue.  HENT: Negative for congestion.   Respiratory: Negative for cough.   Cardiovascular: Negative for chest pain.  Gastrointestinal: Negative for abdominal pain.  Endocrine: Negative for polydipsia and polyphagia.  Neurological: Negative for weakness.  Psychiatric/Behavioral: Negative for confusion.       Objective:   Physical Exam  Constitutional: She appears well-nourished. No distress.  Cardiovascular: Normal rate, regular rhythm and normal heart sounds.   No murmur heard. Pulmonary/Chest: Effort normal and breath sounds normal. No respiratory distress.  Musculoskeletal: She exhibits no edema.  Lymphadenopathy:    She has no cervical adenopathy.  Neurological: She is alert. She exhibits normal muscle tone.  Psychiatric: Her behavior is normal.  Vitals reviewed.         Assessment & Plan:  Hypertension-overall good control. I did check her blood pressure cuff compared to ours. In my opinion her cuff over reads by 8 points readings she got home looked good. I believe this patient will do fine follow-up again in 6 months  Lab work not needed currently Pneumonia vaccine given Importance of doing bone density discussed.

## 2015-09-07 ENCOUNTER — Ambulatory Visit (HOSPITAL_COMMUNITY)
Admission: RE | Admit: 2015-09-07 | Discharge: 2015-09-07 | Disposition: A | Discharge status: Home / Self Care | Payer: Medicare Other | Source: Ambulatory Visit | Attending: Family Medicine | Admitting: Family Medicine

## 2015-09-07 DIAGNOSIS — Z780 Asymptomatic menopausal state: Secondary | ICD-10-CM | POA: Diagnosis not present

## 2015-09-07 DIAGNOSIS — M8580 Other specified disorders of bone density and structure, unspecified site: Secondary | ICD-10-CM | POA: Diagnosis not present

## 2015-09-08 ENCOUNTER — Encounter: Payer: Self-pay | Admitting: Family Medicine

## 2015-12-12 ENCOUNTER — Ambulatory Visit (INDEPENDENT_AMBULATORY_CARE_PROVIDER_SITE_OTHER): Payer: Medicare Other | Admitting: Family Medicine

## 2015-12-12 VITALS — Ht 66.5 in | Wt 210.8 lb

## 2015-12-12 DIAGNOSIS — T7840XA Allergy, unspecified, initial encounter: Secondary | ICD-10-CM

## 2015-12-12 MED ORDER — PREDNISONE 20 MG PO TABS: ORAL_TABLET | ORAL | Status: DC

## 2015-12-12 MED ORDER — DOXYCYCLINE HYCLATE 100 MG PO CAPS: 100.0000 mg | ORAL_CAPSULE | Freq: Two times a day (BID) | ORAL | Status: DC

## 2015-12-12 NOTE — Progress Notes (Signed)
   Subjective:    Patient ID: Alicia Wu, female    DOB: 10/13/1951, 65 y.o.   MRN: [id number]  HPI Patient arrives with c/o left hand swelling with a blister since Sat. Patient wore some new perfume on Sat and wonders if it related to that.  happen soon after the exposure to this on her skin  She's never had this before She denies any bug bite Denies any pain discomfort fever or chills. PMH benign.  Review of Systems  see above no chest tightness pressure pain shortness breath or difficulty swallowing no    Objective:   Physical Exam   right arm normal left hand some redness swelling no tenderness she relates itching she relates a blistered area on her small finger rest of exam normal patient not toxic      Assessment & Plan:   left hand swelling with blistered areas some warmth and redness no tenderness appears to be more of an allergic reaction   Will cover with antibiotics as well as prednisone taper warning signs discussed follow-up if problems no need for lab work currently but may need lab work if situation worsens patient was told if she starts having pain fever chills follow-up immediately

## 2015-12-12 NOTE — Patient Instructions (Signed)
Use meds   Should gradually get bet if not better please call

## 2015-12-15 ENCOUNTER — Telehealth: Payer: Self-pay | Admitting: Family Medicine

## 2015-12-15 NOTE — Telephone Encounter (Signed)
Pt called stating that the swelling in her hand has went down and the blister burst. Pt states that she is going to continue to take her medication and says thank you.

## 2016-03-02 ENCOUNTER — Ambulatory Visit: Payer: Medicare Other | Admitting: Family Medicine

## 2016-03-06 DIAGNOSIS — Z01419 Encounter for gynecological examination (general) (routine) without abnormal findings: Secondary | ICD-10-CM | POA: Diagnosis not present

## 2016-03-06 DIAGNOSIS — Z6832 Body mass index (BMI) 32.0-32.9, adult: Secondary | ICD-10-CM | POA: Diagnosis not present

## 2016-03-07 ENCOUNTER — Encounter: Payer: Self-pay | Admitting: Family Medicine

## 2016-03-07 ENCOUNTER — Ambulatory Visit (INDEPENDENT_AMBULATORY_CARE_PROVIDER_SITE_OTHER): Payer: Medicare Other | Admitting: Family Medicine

## 2016-03-07 VITALS — BP 128/84 | Ht 66.5 in | Wt 203.8 lb

## 2016-03-07 DIAGNOSIS — E785 Hyperlipidemia, unspecified: Secondary | ICD-10-CM

## 2016-03-07 DIAGNOSIS — I10 Essential (primary) hypertension: Secondary | ICD-10-CM

## 2016-03-07 MED ORDER — AMLODIPINE BESYLATE 10 MG PO TABS: ORAL_TABLET | ORAL | Status: DC

## 2016-03-07 MED ORDER — PRAVASTATIN SODIUM 40 MG PO TABS: 40.0000 mg | ORAL_TABLET | Freq: Every evening | ORAL | Status: DC

## 2016-03-07 NOTE — Progress Notes (Signed)
   Subjective:    Patient ID: Alicia Wu, female    DOB: 12-15-1950, 65 y.o.   MRN: [id number]  Hypertension This is a chronic problem. The current episode started more than 1 year ago. Pertinent negatives include no chest pain. Risk factors for coronary artery disease include dyslipidemia and post-menopausal state. Treatments tried: norvasc. There are no compliance problems.     Patient does take her cholesterol medicine on a regular basis watches how she stays physically active.  Review of Systems  Constitutional: Negative for activity change, appetite change and fatigue.  HENT: Negative for congestion.   Respiratory: Negative for cough.   Cardiovascular: Negative for chest pain.  Gastrointestinal: Negative for abdominal pain.  Endocrine: Negative for polydipsia and polyphagia.  Neurological: Negative for weakness.  Psychiatric/Behavioral: Negative for confusion.   She does take her 81 mg aspirin    Objective:   Physical Exam  Constitutional: She appears well-nourished. No distress.  Cardiovascular: Normal rate, regular rhythm and normal heart sounds.   No murmur heard. Pulmonary/Chest: Effort normal and breath sounds normal. No respiratory distress.  Musculoskeletal: She exhibits no edema.  Lymphadenopathy:    She has no cervical adenopathy.  Neurological: She is alert. She exhibits normal muscle tone.  Psychiatric: Her behavior is normal.  Vitals reviewed.         Assessment & Plan:  Hypertension doing well with medication continue medication follow-up if ongoing troubles recheck if problems Encourage patient healthy and try to watch diet try to lose weight Hyperlipidemia check lab work await the results. Recent results of last year were reviewed with patient patient taking medicine tolerating well

## 2016-03-08 ENCOUNTER — Encounter: Payer: Self-pay | Admitting: Family Medicine

## 2016-03-08 LAB — LIPID PANEL
Chol/HDL Ratio: 2.3 ratio units (ref 0.0–4.4)
Cholesterol, Total: 215 mg/dL — ABNORMAL HIGH (ref 100–199)
HDL: 94 mg/dL (ref >39)
LDL CALC: 108 mg/dL — AB (ref 0–99)
Triglycerides: 64 mg/dL (ref 0–149)
VLDL Cholesterol Cal: 13 mg/dL (ref 5–40)

## 2016-03-08 LAB — BASIC METABOLIC PANEL
BUN / CREAT RATIO: 10 — AB (ref 12–28)
BUN: 11 mg/dL (ref 8–27)
CO2: 25 mmol/L (ref 18–29)
CREATININE: 1.06 mg/dL — AB (ref 0.57–1.00)
Calcium: 9.8 mg/dL (ref 8.7–10.3)
Chloride: 102 mmol/L (ref 96–106)
GFR calc non Af Amer: 55 mL/min/{1.73_m2} — ABNORMAL LOW (ref >59)
GFR, EST AFRICAN AMERICAN: 64 mL/min/{1.73_m2} (ref >59)
Glucose: 95 mg/dL (ref 65–99)
Potassium: 4.2 mmol/L (ref 3.5–5.2)
Sodium: 142 mmol/L (ref 134–144)

## 2016-03-08 LAB — HEPATIC FUNCTION PANEL
ALT: 23 IU/L (ref 0–32)
AST: 35 IU/L (ref 0–40)
Albumin: 4.7 g/dL (ref 3.6–4.8)
Alkaline Phosphatase: 96 IU/L (ref 39–117)
BILIRUBIN, DIRECT: 0.13 mg/dL (ref 0.00–0.40)
Bilirubin Total: 0.4 mg/dL (ref 0.0–1.2)
TOTAL PROTEIN: 7.5 g/dL (ref 6.0–8.5)

## 2016-05-21 ENCOUNTER — Other Ambulatory Visit: Payer: Self-pay | Admitting: Family Medicine

## 2016-05-21 DIAGNOSIS — Z1231 Encounter for screening mammogram for malignant neoplasm of breast: Secondary | ICD-10-CM

## 2016-05-31 ENCOUNTER — Ambulatory Visit (HOSPITAL_COMMUNITY)
Admission: RE | Admit: 2016-05-31 | Discharge: 2016-05-31 | Disposition: A | Discharge status: Home / Self Care | Payer: Medicare Other | Source: Ambulatory Visit | Attending: Family Medicine | Admitting: Family Medicine

## 2016-05-31 DIAGNOSIS — Z1231 Encounter for screening mammogram for malignant neoplasm of breast: Secondary | ICD-10-CM | POA: Diagnosis not present

## 2016-08-28 DIAGNOSIS — H401131 Primary open-angle glaucoma, bilateral, mild stage: Secondary | ICD-10-CM | POA: Diagnosis not present

## 2016-08-28 DIAGNOSIS — Z23 Encounter for immunization: Secondary | ICD-10-CM | POA: Diagnosis not present

## 2016-09-03 ENCOUNTER — Other Ambulatory Visit: Payer: Self-pay | Admitting: Family Medicine

## 2016-09-06 ENCOUNTER — Ambulatory Visit (INDEPENDENT_AMBULATORY_CARE_PROVIDER_SITE_OTHER): Payer: Medicare Other | Admitting: Family Medicine

## 2016-09-06 ENCOUNTER — Encounter: Payer: Self-pay | Admitting: Family Medicine

## 2016-09-06 VITALS — BP 130/80 | Ht 66.5 in | Wt 204.1 lb

## 2016-09-06 DIAGNOSIS — E784 Other hyperlipidemia: Secondary | ICD-10-CM | POA: Diagnosis not present

## 2016-09-06 DIAGNOSIS — Z23 Encounter for immunization: Secondary | ICD-10-CM | POA: Diagnosis not present

## 2016-09-06 DIAGNOSIS — I10 Essential (primary) hypertension: Secondary | ICD-10-CM | POA: Diagnosis not present

## 2016-09-06 DIAGNOSIS — E7849 Other hyperlipidemia: Secondary | ICD-10-CM

## 2016-09-06 MED ORDER — AMLODIPINE BESYLATE 10 MG PO TABS: ORAL_TABLET | ORAL | 6 refills | Status: DC

## 2016-09-06 MED ORDER — PRAVASTATIN SODIUM 40 MG PO TABS: 40.0000 mg | ORAL_TABLET | Freq: Every evening | ORAL | 2 refills | Status: DC

## 2016-09-06 NOTE — Progress Notes (Signed)
   Subjective:    Patient ID: Alicia SimasJudy M Bridgers, female    DOB: 1951-08-30, 65 y.o.   MRN: [id number]  Hypertension  This is a chronic problem. The current episode started more than 1 year ago. The problem has been gradually improving since onset. Pertinent negatives include no chest pain, headaches or shortness of breath. There are no associated agents to hypertension. There are no known risk factors for coronary artery disease. Treatments tried: amlodipine. The current treatment provides moderate improvement. There are no compliance problems.    Patient has no other concerns at this time.    Review of Systems  Constitutional: Negative for activity change, fatigue and fever.  Respiratory: Negative for cough and shortness of breath.   Cardiovascular: Negative for chest pain and leg swelling.  Neurological: Negative for headaches.       Objective:   Physical Exam  Constitutional: She appears well-nourished. No distress.  Cardiovascular: Normal rate, regular rhythm and normal heart sounds.   No murmur heard. Pulmonary/Chest: Effort normal and breath sounds normal. No respiratory distress.  Musculoskeletal: She exhibits no edema.  Lymphadenopathy:    She has no cervical adenopathy.  Neurological: She is alert. She exhibits normal muscle tone.  Psychiatric: Her behavior is normal.  Vitals reviewed.  She does try to walk 2-3 times per week if not more in she does try to stay active and tries to watch her diet no accidents no injuries no falls       Assessment & Plan:  Hypertension fair control blood pressure 148/86 I would like to see the top number in the 130s she will follow-up next week for a courtesy blood pressure check may need to adjust medication  Hyperlipidemia previous labs look good no adjustment necessary currently follow-up 6 months labs at that time

## 2016-09-06 NOTE — Patient Instructions (Signed)
DASH Eating Plan  DASH stands for "Dietary Approaches to Stop Hypertension." The DASH eating plan is a healthy eating plan that has been shown to reduce high blood pressure (hypertension). Additional health benefits may include reducing the risk of type 2 diabetes mellitus, heart disease, and stroke. The DASH eating plan may also help with weight loss.  WHAT DO I NEED TO KNOW ABOUT THE DASH EATING PLAN?  For the DASH eating plan, you will follow these general guidelines:  · Choose foods with a percent daily value for sodium of less than 5% (as listed on the food label).  · Use salt-free seasonings or herbs instead of table salt or sea salt.  · Check with your health care provider or pharmacist before using salt substitutes.  · Eat lower-sodium products, often labeled as "lower sodium" or "no salt added."  · Eat fresh foods.  · Eat more vegetables, fruits, and low-fat dairy products.  · Choose whole grains. Look for the word "whole" as the first word in the ingredient list.  · Choose fish and skinless chicken or turkey more often than red meat. Limit fish, poultry, and meat to 6 oz (170 g) each day.  · Limit sweets, desserts, sugars, and sugary drinks.  · Choose heart-healthy fats.  · Limit cheese to 1 oz (28 g) per day.  · Eat more home-cooked food and less restaurant, buffet, and fast food.  · Limit fried foods.  · Cook foods using methods other than frying.  · Limit canned vegetables. If you do use them, rinse them well to decrease the sodium.  · When eating at a restaurant, ask that your food be prepared with less salt, or no salt if possible.  WHAT FOODS CAN I EAT?  Seek help from a dietitian for individual calorie needs.  Grains  Whole grain or whole wheat bread. Brown rice. Whole grain or whole wheat pasta. Quinoa, bulgur, and whole grain cereals. Low-sodium cereals. Corn or whole wheat flour tortillas. Whole grain cornbread. Whole grain crackers. Low-sodium crackers.  Vegetables  Fresh or frozen vegetables  (raw, steamed, roasted, or grilled). Low-sodium or reduced-sodium tomato and vegetable juices. Low-sodium or reduced-sodium tomato sauce and paste. Low-sodium or reduced-sodium canned vegetables.   Fruits  All fresh, canned (in natural juice), or frozen fruits.  Meat and Other Protein Products  Ground beef (85% or leaner), grass-fed beef, or beef trimmed of fat. Skinless chicken or turkey. Ground chicken or turkey. Pork trimmed of fat. All fish and seafood. Eggs. Dried beans, peas, or lentils. Unsalted nuts and seeds. Unsalted canned beans.  Dairy  Low-fat dairy products, such as skim or 1% milk, 2% or reduced-fat cheeses, low-fat ricotta or cottage cheese, or plain low-fat yogurt. Low-sodium or reduced-sodium cheeses.  Fats and Oils  Tub margarines without trans fats. Light or reduced-fat mayonnaise and salad dressings (reduced sodium). Avocado. Safflower, olive, or canola oils. Natural peanut or almond butter.  Other  Unsalted popcorn and pretzels.  The items listed above may not be a complete list of recommended foods or beverages. Contact your dietitian for more options.  WHAT FOODS ARE NOT RECOMMENDED?  Grains  White bread. White pasta. White rice. Refined cornbread. Bagels and croissants. Crackers that contain trans fat.  Vegetables  Creamed or fried vegetables. Vegetables in a cheese sauce. Regular canned vegetables. Regular canned tomato sauce and paste. Regular tomato and vegetable juices.  Fruits  Dried fruits. Canned fruit in light or heavy syrup. Fruit juice.  Meat and Other Protein   Products  Fatty cuts of meat. Ribs, chicken wings, bacon, sausage, bologna, salami, chitterlings, fatback, hot dogs, bratwurst, and packaged luncheon meats. Salted nuts and seeds. Canned beans with salt.  Dairy  Whole or 2% milk, cream, half-and-half, and cream cheese. Whole-fat or sweetened yogurt. Full-fat cheeses or blue cheese. Nondairy creamers and whipped toppings. Processed cheese, cheese spreads, or cheese  curds.  Condiments  Onion and garlic salt, seasoned salt, table salt, and sea salt. Canned and packaged gravies. Worcestershire sauce. Tartar sauce. Barbecue sauce. Teriyaki sauce. Soy sauce, including reduced sodium. Steak sauce. Fish sauce. Oyster sauce. Cocktail sauce. Horseradish. Ketchup and mustard. Meat flavorings and tenderizers. Bouillon cubes. Hot sauce. Tabasco sauce. Marinades. Taco seasonings. Relishes.  Fats and Oils  Butter, stick margarine, lard, shortening, ghee, and bacon fat. Coconut, palm kernel, or palm oils. Regular salad dressings.  Other  Pickles and olives. Salted popcorn and pretzels.  The items listed above may not be a complete list of foods and beverages to avoid. Contact your dietitian for more information.  WHERE CAN I FIND MORE INFORMATION?  National Heart, Lung, and Blood Institute: www.nhlbi.nih.gov/health/health-topics/topics/dash/     This information is not intended to replace advice given to you by your health care provider. Make sure you discuss any questions you have with your health care provider.     Document Released: 10/25/2011 Document Revised: 11/26/2014 Document Reviewed: 09/09/2013  Elsevier Interactive Patient Education ©2016 [employer]

## 2016-09-11 ENCOUNTER — Ambulatory Visit (INDEPENDENT_AMBULATORY_CARE_PROVIDER_SITE_OTHER): Payer: Medicare Other | Admitting: Family Medicine

## 2016-09-11 VITALS — BP 148/92

## 2016-09-11 DIAGNOSIS — I10 Essential (primary) hypertension: Secondary | ICD-10-CM

## 2016-09-11 MED ORDER — POTASSIUM CHLORIDE ER 10 MEQ PO TBCR: 10.0000 meq | EXTENDED_RELEASE_TABLET | Freq: Every day | ORAL | 3 refills | Status: DC

## 2016-09-11 MED ORDER — HYDROCHLOROTHIAZIDE 25 MG PO TABS: 25.0000 mg | ORAL_TABLET | Freq: Every day | ORAL | 3 refills | Status: DC

## 2016-09-11 NOTE — Progress Notes (Signed)
Patient with ongoing hypertension. Add HCTZ 25 mg a day along with potassium 10 mEq daily follow-up 3-4 weeks. We'll do a blood pressure check at that point

## 2016-10-09 ENCOUNTER — Encounter: Payer: Self-pay | Admitting: Family Medicine

## 2016-10-09 ENCOUNTER — Ambulatory Visit (INDEPENDENT_AMBULATORY_CARE_PROVIDER_SITE_OTHER): Payer: Medicare Other | Admitting: Family Medicine

## 2016-10-09 VITALS — BP 136/86 | Ht 65.5 in | Wt 204.8 lb

## 2016-10-09 DIAGNOSIS — I10 Essential (primary) hypertension: Secondary | ICD-10-CM

## 2016-10-09 MED ORDER — TRIAMTERENE-HCTZ 37.5-25 MG PO TABS: 1.0000 | ORAL_TABLET | Freq: Every day | ORAL | 5 refills | Status: DC

## 2016-10-09 NOTE — Patient Instructions (Signed)
DASH Eating Plan DASH stands for "Dietary Approaches to Stop Hypertension." The DASH eating plan is a healthy eating plan that has been shown to reduce high blood pressure (hypertension). Additional health benefits may include reducing the risk of type 2 diabetes mellitus, heart disease, and stroke. The DASH eating plan may also help with weight loss. What do I need to know about the DASH eating plan? For the DASH eating plan, you will follow these general guidelines:  Choose foods with less than 150 milligrams of sodium per serving (as listed on the food label).  Use salt-free seasonings or herbs instead of table salt or sea salt.  Check with your health care provider or pharmacist before using salt substitutes.  Eat lower-sodium products. These are often labeled as "low-sodium" or "no salt added."  Eat fresh foods. Avoid eating a lot of canned foods.  Eat more vegetables, fruits, and low-fat dairy products.  Choose whole grains. Look for the word "whole" as the first word in the ingredient list.  Choose fish and skinless chicken or turkey more often than red meat. Limit fish, poultry, and meat to 6 oz (170 g) each day.  Limit sweets, desserts, sugars, and sugary drinks.  Choose heart-healthy fats.  Eat more home-cooked food and less restaurant, buffet, and fast food.  Limit fried foods.  Do not fry foods. Cook foods using methods such as baking, boiling, grilling, and broiling instead.  When eating at a restaurant, ask that your food be prepared with less salt, or no salt if possible. What foods can I eat? Seek help from a dietitian for individual calorie needs. Grains  Whole grain or whole wheat bread. Brown rice. Whole grain or whole wheat pasta. Quinoa, bulgur, and whole grain cereals. Low-sodium cereals. Corn or whole wheat flour tortillas. Whole grain cornbread. Whole grain crackers. Low-sodium crackers. Vegetables  Fresh or frozen vegetables (raw, steamed, roasted, or  grilled). Low-sodium or reduced-sodium tomato and vegetable juices. Low-sodium or reduced-sodium tomato sauce and paste. Low-sodium or reduced-sodium canned vegetables. Fruits  All fresh, canned (in natural juice), or frozen fruits. Meat and Other Protein Products  Ground beef (85% or leaner), grass-fed beef, or beef trimmed of fat. Skinless chicken or turkey. Ground chicken or turkey. Pork trimmed of fat. All fish and seafood. Eggs. Dried beans, peas, or lentils. Unsalted nuts and seeds. Unsalted canned beans. Dairy  Low-fat dairy products, such as skim or 1% milk, 2% or reduced-fat cheeses, low-fat ricotta or cottage cheese, or plain low-fat yogurt. Low-sodium or reduced-sodium cheeses. Fats and Oils  Tub margarines without trans fats. Light or reduced-fat mayonnaise and salad dressings (reduced sodium). Avocado. Safflower, olive, or canola oils. Natural peanut or almond butter. Other  Unsalted popcorn and pretzels. The items listed above may not be a complete list of recommended foods or beverages. Contact your dietitian for more options.  What foods are not recommended? Grains  White bread. White pasta. White rice. Refined cornbread. Bagels and croissants. Crackers that contain trans fat. Vegetables  Creamed or fried vegetables. Vegetables in a cheese sauce. Regular canned vegetables. Regular canned tomato sauce and paste. Regular tomato and vegetable juices. Fruits  Canned fruit in light or heavy syrup. Fruit juice. Meat and Other Protein Products  Fatty cuts of meat. Ribs, chicken wings, bacon, sausage, bologna, salami, chitterlings, fatback, hot dogs, bratwurst, and packaged luncheon meats. Salted nuts and seeds. Canned beans with salt. Dairy  Whole or 2% milk, cream, half-and-half, and cream cheese. Whole-fat or sweetened yogurt. Full-fat cheeses   or blue cheese. Nondairy creamers and whipped toppings. Processed cheese, cheese spreads, or cheese curds. Condiments  Onion and garlic  salt, seasoned salt, table salt, and sea salt. Canned and packaged gravies. Worcestershire sauce. Tartar sauce. Barbecue sauce. Teriyaki sauce. Soy sauce, including reduced sodium. Steak sauce. Fish sauce. Oyster sauce. Cocktail sauce. Horseradish. Ketchup and mustard. Meat flavorings and tenderizers. Bouillon cubes. Hot sauce. Tabasco sauce. Marinades. Taco seasonings. Relishes. Fats and Oils  Butter, stick margarine, lard, shortening, ghee, and bacon fat. Coconut, palm kernel, or palm oils. Regular salad dressings. Other  Pickles and olives. Salted popcorn and pretzels. The items listed above may not be a complete list of foods and beverages to avoid. Contact your dietitian for more information.  Where can I find more information? National Heart, Lung, and Blood Institute: www.nhlbi.nih.gov/health/health-topics/topics/dash/ This information is not intended to replace advice given to you by your health care provider. Make sure you discuss any questions you have with your health care provider. Document Released: 10/25/2011 Document Revised: 04/12/2016 Document Reviewed: 09/09/2013 Elsevier Interactive Patient Education  2017 [employer] Inc[employer]  

## 2016-10-09 NOTE — Progress Notes (Signed)
   Subjective:    Patient ID: Alicia Wu, female    DOB: 1951/01/18, 65 y.o.   MRN: [id number]  Hypertension  This is a chronic problem. The current episode started more than 1 year ago. Pertinent negatives include no chest pain, headaches or shortness of breath. Risk factors for coronary artery disease include post-menopausal state. Treatments tried: hctz, norvasc.  Denies any chest tightness pressure pain does try to good job of eating healthy staying physically active.  Review of Systems  Constitutional: Negative for activity change, fatigue and fever.  Respiratory: Negative for cough and shortness of breath.   Cardiovascular: Negative for chest pain and leg swelling.  Neurological: Negative for headaches.       Objective:   Physical Exam  Constitutional: She appears well-nourished. No distress.  Cardiovascular: Normal rate, regular rhythm and normal heart sounds.   No murmur heard. Pulmonary/Chest: Effort normal and breath sounds normal. No respiratory distress.  Musculoskeletal: She exhibits no edema.  Lymphadenopathy:    She has no cervical adenopathy.  Neurological: She is alert. She exhibits normal muscle tone.  Psychiatric: Her behavior is normal.  Vitals reviewed.         Assessment & Plan:  HTN subpar control change diuretic to triamterene hydrochlorothiazide. Follow-up in several weeks to recheck blood pressure. Watch diet closely.

## 2016-11-01 ENCOUNTER — Ambulatory Visit (INDEPENDENT_AMBULATORY_CARE_PROVIDER_SITE_OTHER): Payer: Medicare Other | Admitting: Family Medicine

## 2016-11-01 ENCOUNTER — Encounter: Payer: Self-pay | Admitting: Family Medicine

## 2016-11-01 VITALS — BP 134/86 | Ht 65.5 in | Wt 204.0 lb

## 2016-11-01 DIAGNOSIS — I10 Essential (primary) hypertension: Secondary | ICD-10-CM | POA: Diagnosis not present

## 2016-11-01 NOTE — Progress Notes (Signed)
   Subjective:    Patient ID: Alicia Wu, female    DOB: 10-07-1951, 65 y.o.   MRN: [id number]  Hypertension  This is a chronic problem. The current episode started more than 1 year ago. The problem has been gradually improving since onset. Pertinent negatives include no chest pain. There are no associated agents to hypertension. There are no known risk factors for coronary artery disease. Treatments tried: triamterene-hctz. The current treatment provides moderate improvement. There are no compliance problems.    Patient has no other concerns at this time.  The patient had a change of her therapy on the last visit since and she states she is active doing fairly good she denies any setbacks she does try to watch her diet  Review of Systems  Constitutional: Negative for activity change, appetite change and fatigue.  HENT: Negative for congestion.   Respiratory: Negative for cough.   Cardiovascular: Negative for chest pain.  Gastrointestinal: Negative for abdominal pain.  Endocrine: Negative for polydipsia and polyphagia.  Neurological: Negative for weakness.  Psychiatric/Behavioral: Negative for confusion.       Objective:   Physical Exam  Constitutional: She appears well-nourished. No distress.  Cardiovascular: Normal rate, regular rhythm and normal heart sounds.   No murmur heard. Pulmonary/Chest: Effort normal and breath sounds normal. No respiratory distress.  Musculoskeletal: She exhibits no edema.  Lymphadenopathy:    She has no cervical adenopathy.  Neurological: She is alert. She exhibits normal muscle tone.  Psychiatric: Her behavior is normal.  Vitals reviewed.         Assessment & Plan:  HTN- Patient was seen today as part of a visit regarding hypertension. The importance of healthy diet and regular physical activity was discussed. The importance of compliance with medications discussed. Ideal goal is to keep blood pressure low elevated levels certainly below  132/80 when possible. The patient was counseled that keeping blood pressure under control lessen his risk of heart attack, stroke, kidney failure, and early death. The importance of regular follow-ups was discussed with the patient. Low-salt diet such as DASH recommended. Regular physical activity was recommended as well. Patient was advised to keep regular follow-ups.  Stick with healthy diet exercise more often continue medication follow-up in spring may need additional adjustments in blood pressure numbers don't come down more

## 2016-11-01 NOTE — Patient Instructions (Signed)
DASH Eating Plan DASH stands for "Dietary Approaches to Stop Hypertension." The DASH eating plan is a healthy eating plan that has been shown to reduce high blood pressure (hypertension). Additional health benefits may include reducing the risk of type 2 diabetes mellitus, heart disease, and stroke. The DASH eating plan may also help with weight loss. What do I need to know about the DASH eating plan? For the DASH eating plan, you will follow these general guidelines:  Choose foods with less than 150 milligrams of sodium per serving (as listed on the food label).  Use salt-free seasonings or herbs instead of table salt or sea salt.  Check with your health care provider or pharmacist before using salt substitutes.  Eat lower-sodium products. These are often labeled as "low-sodium" or "no salt added."  Eat fresh foods. Avoid eating a lot of canned foods.  Eat more vegetables, fruits, and low-fat dairy products.  Choose whole grains. Look for the word "whole" as the first word in the ingredient list.  Choose fish and skinless chicken or turkey more often than red meat. Limit fish, poultry, and meat to 6 oz (170 g) each day.  Limit sweets, desserts, sugars, and sugary drinks.  Choose heart-healthy fats.  Eat more home-cooked food and less restaurant, buffet, and fast food.  Limit fried foods.  Do not fry foods. Cook foods using methods such as baking, boiling, grilling, and broiling instead.  When eating at a restaurant, ask that your food be prepared with less salt, or no salt if possible. What foods can I eat? Seek help from a dietitian for individual calorie needs. Grains  Whole grain or whole wheat bread. Brown rice. Whole grain or whole wheat pasta. Quinoa, bulgur, and whole grain cereals. Low-sodium cereals. Corn or whole wheat flour tortillas. Whole grain cornbread. Whole grain crackers. Low-sodium crackers. Vegetables  Fresh or frozen vegetables (raw, steamed, roasted, or  grilled). Low-sodium or reduced-sodium tomato and vegetable juices. Low-sodium or reduced-sodium tomato sauce and paste. Low-sodium or reduced-sodium canned vegetables. Fruits  All fresh, canned (in natural juice), or frozen fruits. Meat and Other Protein Products  Ground beef (85% or leaner), grass-fed beef, or beef trimmed of fat. Skinless chicken or turkey. Ground chicken or turkey. Pork trimmed of fat. All fish and seafood. Eggs. Dried beans, peas, or lentils. Unsalted nuts and seeds. Unsalted canned beans. Dairy  Low-fat dairy products, such as skim or 1% milk, 2% or reduced-fat cheeses, low-fat ricotta or cottage cheese, or plain low-fat yogurt. Low-sodium or reduced-sodium cheeses. Fats and Oils  Tub margarines without trans fats. Light or reduced-fat mayonnaise and salad dressings (reduced sodium). Avocado. Safflower, olive, or canola oils. Natural peanut or almond butter. Other  Unsalted popcorn and pretzels. The items listed above may not be a complete list of recommended foods or beverages. Contact your dietitian for more options.  What foods are not recommended? Grains  White bread. White pasta. White rice. Refined cornbread. Bagels and croissants. Crackers that contain trans fat. Vegetables  Creamed or fried vegetables. Vegetables in a cheese sauce. Regular canned vegetables. Regular canned tomato sauce and paste. Regular tomato and vegetable juices. Fruits  Canned fruit in light or heavy syrup. Fruit juice. Meat and Other Protein Products  Fatty cuts of meat. Ribs, chicken wings, bacon, sausage, bologna, salami, chitterlings, fatback, hot dogs, bratwurst, and packaged luncheon meats. Salted nuts and seeds. Canned beans with salt. Dairy  Whole or 2% milk, cream, half-and-half, and cream cheese. Whole-fat or sweetened yogurt. Full-fat cheeses   or blue cheese. Nondairy creamers and whipped toppings. Processed cheese, cheese spreads, or cheese curds. Condiments  Onion and garlic  salt, seasoned salt, table salt, and sea salt. Canned and packaged gravies. Worcestershire sauce. Tartar sauce. Barbecue sauce. Teriyaki sauce. Soy sauce, including reduced sodium. Steak sauce. Fish sauce. Oyster sauce. Cocktail sauce. Horseradish. Ketchup and mustard. Meat flavorings and tenderizers. Bouillon cubes. Hot sauce. Tabasco sauce. Marinades. Taco seasonings. Relishes. Fats and Oils  Butter, stick margarine, lard, shortening, ghee, and bacon fat. Coconut, palm kernel, or palm oils. Regular salad dressings. Other  Pickles and olives. Salted popcorn and pretzels. The items listed above may not be a complete list of foods and beverages to avoid. Contact your dietitian for more information.  Where can I find more information? National Heart, Lung, and Blood Institute: www.nhlbi.nih.gov/health/health-topics/topics/dash/ This information is not intended to replace advice given to you by your health care provider. Make sure you discuss any questions you have with your health care provider. Document Released: 10/25/2011 Document Revised: 04/12/2016 Document Reviewed: 09/09/2013 Elsevier Interactive Patient Education  2017 [employer] Inc[employer]  

## 2016-11-02 ENCOUNTER — Encounter: Payer: Self-pay | Admitting: Family Medicine

## 2016-11-02 LAB — BASIC METABOLIC PANEL
BUN / CREAT RATIO: 12 (ref 12–28)
BUN: 12 mg/dL (ref 8–27)
CALCIUM: 9.6 mg/dL (ref 8.7–10.3)
CHLORIDE: 102 mmol/L (ref 96–106)
CO2: 27 mmol/L (ref 18–29)
Creatinine, Ser: 0.98 mg/dL (ref 0.57–1.00)
GFR calc Af Amer: 70 mL/min/{1.73_m2} (ref >59)
GFR calc non Af Amer: 60 mL/min/{1.73_m2} (ref >59)
Glucose: 95 mg/dL (ref 65–99)
Potassium: 4.2 mmol/L (ref 3.5–5.2)
Sodium: 143 mmol/L (ref 134–144)

## 2017-03-07 ENCOUNTER — Encounter: Payer: Self-pay | Admitting: Family Medicine

## 2017-03-07 ENCOUNTER — Ambulatory Visit (INDEPENDENT_AMBULATORY_CARE_PROVIDER_SITE_OTHER): Payer: Medicare Other | Admitting: Family Medicine

## 2017-03-07 VITALS — BP 132/82 | Ht 65.5 in | Wt 196.6 lb

## 2017-03-07 DIAGNOSIS — I10 Essential (primary) hypertension: Secondary | ICD-10-CM

## 2017-03-07 DIAGNOSIS — Z1159 Encounter for screening for other viral diseases: Secondary | ICD-10-CM

## 2017-03-07 DIAGNOSIS — E784 Other hyperlipidemia: Secondary | ICD-10-CM

## 2017-03-07 DIAGNOSIS — E7849 Other hyperlipidemia: Secondary | ICD-10-CM

## 2017-03-07 MED ORDER — PRAVASTATIN SODIUM 40 MG PO TABS: 40.0000 mg | ORAL_TABLET | Freq: Every evening | ORAL | 2 refills | Status: DC

## 2017-03-07 MED ORDER — AMLODIPINE BESYLATE 10 MG PO TABS: ORAL_TABLET | ORAL | 2 refills | Status: DC

## 2017-03-07 MED ORDER — TRIAMTERENE-HCTZ 37.5-25 MG PO TABS: 1.0000 | ORAL_TABLET | Freq: Every day | ORAL | 1 refills | Status: DC

## 2017-03-07 NOTE — Progress Notes (Signed)
   Subjective:    Patient ID: Alicia Wu, female    DOB: 1950-12-10, 66 y.o.   MRN: [id number]  Hypertension  This is a chronic problem. The current episode started more than 1 year ago. Pertinent negatives include no chest pain, headaches or shortness of breath. Risk factors for coronary artery disease include post-menopausal state and dyslipidemia. Treatments tried: norvasc. There are no compliance problems.    Patient relates she is staying physically active she is taken her cholesterol medicine she denies any problems with medications   Review of Systems  Constitutional: Negative for activity change, fatigue and fever.  Respiratory: Negative for cough and shortness of breath.   Cardiovascular: Negative for chest pain and leg swelling.  Neurological: Negative for headaches.       Objective:   Physical Exam  Constitutional: She appears well-nourished. No distress.  Cardiovascular: Normal rate, regular rhythm and normal heart sounds.   No murmur heard. Pulmonary/Chest: Effort normal and breath sounds normal. No respiratory distress.  Musculoskeletal: She exhibits no edema.  Lymphadenopathy:    She has no cervical adenopathy.  Neurological: She is alert. She exhibits normal muscle tone.  Psychiatric: Her behavior is normal.  Vitals reviewed.         Assessment & Plan:  HTN good control continue current measures watch diet stay physically active  Hyperlipidemia previous labs reviewed. The importance of regular physical activity taken her medicine discussed.   Work ordered. Await the results  Follow-up 6 months

## 2017-03-08 ENCOUNTER — Encounter: Payer: Self-pay | Admitting: Family Medicine

## 2017-03-08 LAB — LIPID PANEL
Chol/HDL Ratio: 2.5 ratio (ref 0.0–4.4)
Cholesterol, Total: 198 mg/dL (ref 100–199)
HDL: 79 mg/dL (ref >39)
LDL Calculated: 103 mg/dL — ABNORMAL HIGH (ref 0–99)
Triglycerides: 80 mg/dL (ref 0–149)
VLDL CHOLESTEROL CAL: 16 mg/dL (ref 5–40)

## 2017-03-08 LAB — HEPATIC FUNCTION PANEL
ALBUMIN: 4.8 g/dL (ref 3.6–4.8)
ALT: 26 IU/L (ref 0–32)
AST: 34 IU/L (ref 0–40)
Alkaline Phosphatase: 82 IU/L (ref 39–117)
Bilirubin Total: 0.4 mg/dL (ref 0.0–1.2)
Bilirubin, Direct: 0.11 mg/dL (ref 0.00–0.40)
TOTAL PROTEIN: 7.8 g/dL (ref 6.0–8.5)

## 2017-03-08 LAB — BASIC METABOLIC PANEL
BUN/Creatinine Ratio: 9 — ABNORMAL LOW (ref 12–28)
BUN: 10 mg/dL (ref 8–27)
CALCIUM: 10.4 mg/dL — AB (ref 8.7–10.3)
CO2: 24 mmol/L (ref 18–29)
Chloride: 98 mmol/L (ref 96–106)
Creatinine, Ser: 1.15 mg/dL — ABNORMAL HIGH (ref 0.57–1.00)
GFR calc Af Amer: 57 mL/min/{1.73_m2} — ABNORMAL LOW (ref >59)
GFR, EST NON AFRICAN AMERICAN: 50 mL/min/{1.73_m2} — AB (ref >59)
Glucose: 99 mg/dL (ref 65–99)
Potassium: 4 mmol/L (ref 3.5–5.2)
SODIUM: 141 mmol/L (ref 134–144)

## 2017-03-08 LAB — HEPATITIS C ANTIBODY: Hep C Virus Ab: <0.1 s/co ratio (ref 0.0–0.9)

## 2017-04-22 ENCOUNTER — Other Ambulatory Visit (HOSPITAL_COMMUNITY): Payer: Self-pay | Admitting: Obstetrics and Gynecology

## 2017-04-22 DIAGNOSIS — Z1231 Encounter for screening mammogram for malignant neoplasm of breast: Secondary | ICD-10-CM

## 2017-06-03 ENCOUNTER — Ambulatory Visit (HOSPITAL_COMMUNITY)
Admission: RE | Admit: 2017-06-03 | Discharge: 2017-06-03 | Disposition: A | Discharge status: Home / Self Care | Payer: Medicare Other | Source: Ambulatory Visit | Attending: Obstetrics and Gynecology | Admitting: Obstetrics and Gynecology

## 2017-06-03 DIAGNOSIS — Z1231 Encounter for screening mammogram for malignant neoplasm of breast: Secondary | ICD-10-CM | POA: Insufficient documentation

## 2017-06-07 ENCOUNTER — Other Ambulatory Visit: Payer: Self-pay

## 2017-08-28 DIAGNOSIS — Z23 Encounter for immunization: Secondary | ICD-10-CM | POA: Diagnosis not present

## 2017-09-05 ENCOUNTER — Ambulatory Visit: Payer: Medicare Other | Admitting: Family Medicine

## 2017-09-10 ENCOUNTER — Encounter: Payer: Self-pay | Admitting: Family Medicine

## 2017-09-10 ENCOUNTER — Ambulatory Visit (INDEPENDENT_AMBULATORY_CARE_PROVIDER_SITE_OTHER): Payer: Medicare Other | Admitting: Family Medicine

## 2017-09-10 VITALS — BP 128/86 | Ht 65.5 in | Wt 192.5 lb

## 2017-09-10 DIAGNOSIS — I10 Essential (primary) hypertension: Secondary | ICD-10-CM | POA: Diagnosis not present

## 2017-09-10 DIAGNOSIS — E7849 Other hyperlipidemia: Secondary | ICD-10-CM

## 2017-09-10 MED ORDER — TRIAMTERENE-HCTZ 37.5-25 MG PO TABS: 1.0000 | ORAL_TABLET | Freq: Every day | ORAL | 1 refills | Status: DC

## 2017-09-10 MED ORDER — AMLODIPINE BESYLATE 10 MG PO TABS: ORAL_TABLET | ORAL | 1 refills | Status: DC

## 2017-09-10 NOTE — Progress Notes (Signed)
   Subjective:    Patient ID: Alicia SimasJudy M Corpus, female    DOB: [birth year], 66 y.o.   MRN: [id number]  Hypertension  This is a chronic problem. The current episode started more than 1 year ago. Pertinent negatives include no chest pain, headaches or shortness of breath.    Patient states no concerns this visit. Patient takes cholesterol medicine regular basis keeps it under decent control Takes her blood pressure medicine regular basis overall keeps under good control Mild obesity she tries keep it under decent control watch by watching her diet and staying active Denies any depression  Review of Systems  Constitutional: Negative for activity change, fatigue and fever.  HENT: Negative for congestion.   Respiratory: Negative for cough, chest tightness and shortness of breath.   Cardiovascular: Negative for chest pain and leg swelling.  Gastrointestinal: Negative for abdominal pain.  Skin: Negative for color change.  Neurological: Negative for headaches.  Psychiatric/Behavioral: Negative for behavioral problems.       Objective:   Physical Exam  Constitutional: She appears well-developed and well-nourished. No distress.  HENT:  Head: Normocephalic and atraumatic.  Eyes: Right eye exhibits no discharge. Left eye exhibits no discharge.  Neck: No tracheal deviation present.  Cardiovascular: Normal rate, regular rhythm and normal heart sounds.   No murmur heard. Pulmonary/Chest: Effort normal and breath sounds normal. No respiratory distress. She has no wheezes. She has no rales.  Musculoskeletal: She exhibits no edema.  Lymphadenopathy:    She has no cervical adenopathy.  Neurological: She is alert. She exhibits normal muscle tone.  Skin: Skin is warm and dry. No erythema.  Psychiatric: Her behavior is normal.  Vitals reviewed.         Assessment & Plan:  HTN good control continue current measures Cholesterol good control continue current measures Metabolic 7 in  order Follow-up 6 months sooner if any problems

## 2017-09-10 NOTE — Patient Instructions (Signed)
DASH Eating Plan DASH stands for "Dietary Approaches to Stop Hypertension." The DASH eating plan is a healthy eating plan that has been shown to reduce high blood pressure (hypertension). It may also reduce your risk for type 2 diabetes, heart disease, and stroke. The DASH eating plan may also help with weight loss. What are tips for following this plan? General guidelines  Avoid eating more than 2,300 mg (milligrams) of salt (sodium) a day. If you have hypertension, you may need to reduce your sodium intake to 1,500 mg a day.  Limit alcohol intake to no more than 1 drink a day for nonpregnant women and 2 drinks a day for men. One drink equals 12 oz of beer, 5 oz of wine, or 1 oz of hard liquor.  Work with your health care provider to maintain a healthy body weight or to lose weight. Ask what an ideal weight is for you.  Get at least 30 minutes of exercise that causes your heart to beat faster (aerobic exercise) most days of the week. Activities may include walking, swimming, or biking.  Work with your health care provider or diet and nutrition specialist (dietitian) to adjust your eating plan to your individual calorie needs. Reading food labels  Check food labels for the amount of sodium per serving. Choose foods with less than 5 percent of the Daily Value of sodium. Generally, foods with less than 300 mg of sodium per serving fit into this eating plan.  To find whole grains, look for the word "whole" as the first word in the ingredient list. Shopping  Buy products labeled as "low-sodium" or "no salt added."  Buy fresh foods. Avoid canned foods and premade or frozen meals. Cooking  Avoid adding salt when cooking. Use salt-free seasonings or herbs instead of table salt or sea salt. Check with your health care provider or pharmacist before using salt substitutes.  Do not fry foods. Cook foods using healthy methods such as baking, boiling, grilling, and broiling instead.  Cook with  heart-healthy oils, such as olive, canola, soybean, or sunflower oil. Meal planning   Eat a balanced diet that includes: ? 5 or more servings of fruits and vegetables each day. At each meal, try to fill half of your plate with fruits and vegetables. ? Up to 6-8 servings of whole grains each day. ? Less than 6 oz of lean meat, poultry, or fish each day. A 3-oz serving of meat is about the same size as a deck of cards. One egg equals 1 oz. ? 2 servings of low-fat dairy each day. ? A serving of nuts, seeds, or beans 5 times each week. ? Heart-healthy fats. Healthy fats called Omega-3 fatty acids are found in foods such as flaxseeds and coldwater fish, like sardines, salmon, and mackerel.  Limit how much you eat of the following: ? Canned or prepackaged foods. ? Food that is high in trans fat, such as fried foods. ? Food that is high in saturated fat, such as fatty meat. ? Sweets, desserts, sugary drinks, and other foods with added sugar. ? Full-fat dairy products.  Do not salt foods before eating.  Try to eat at least 2 vegetarian meals each week.  Eat more home-cooked food and less restaurant, buffet, and fast food.  When eating at a restaurant, ask that your food be prepared with less salt or no salt, if possible. What foods are recommended? The items listed may not be a complete list. Talk with your dietitian about what   dietary choices are best for you. Grains Whole-grain or whole-wheat bread. Whole-grain or whole-wheat pasta. Brown rice. Oatmeal. Quinoa. Bulgur. Whole-grain and low-sodium cereals. Pita bread. Low-fat, low-sodium crackers. Whole-wheat flour tortillas. Vegetables Fresh or frozen vegetables (raw, steamed, roasted, or grilled). Low-sodium or reduced-sodium tomato and vegetable juice. Low-sodium or reduced-sodium tomato sauce and tomato paste. Low-sodium or reduced-sodium canned vegetables. Fruits All fresh, dried, or frozen fruit. Canned fruit in natural juice (without  added sugar). Meat and other protein foods Skinless chicken or turkey. Ground chicken or turkey. Pork with fat trimmed off. Fish and seafood. Egg whites. Dried beans, peas, or lentils. Unsalted nuts, nut butters, and seeds. Unsalted canned beans. Lean cuts of beef with fat trimmed off. Low-sodium, lean deli meat. Dairy Low-fat (1%) or fat-free (skim) milk. Fat-free, low-fat, or reduced-fat cheeses. Nonfat, low-sodium ricotta or cottage cheese. Low-fat or nonfat yogurt. Low-fat, low-sodium cheese. Fats and oils Soft margarine without trans fats. Vegetable oil. Low-fat, reduced-fat, or light mayonnaise and salad dressings (reduced-sodium). Canola, safflower, olive, soybean, and sunflower oils. Avocado. Seasoning and other foods Herbs. Spices. Seasoning mixes without salt. Unsalted popcorn and pretzels. Fat-free sweets. What foods are not recommended? The items listed may not be a complete list. Talk with your dietitian about what dietary choices are best for you. Grains Baked goods made with fat, such as croissants, muffins, or some breads. Dry pasta or rice meal packs. Vegetables Creamed or fried vegetables. Vegetables in a cheese sauce. Regular canned vegetables (not low-sodium or reduced-sodium). Regular canned tomato sauce and paste (not low-sodium or reduced-sodium). Regular tomato and vegetable juice (not low-sodium or reduced-sodium). Pickles. Olives. Fruits Canned fruit in a light or heavy syrup. Fried fruit. Fruit in cream or butter sauce. Meat and other protein foods Fatty cuts of meat. Ribs. Fried meat. Bacon. Sausage. Bologna and other processed lunch meats. Salami. Fatback. Hotdogs. Bratwurst. Salted nuts and seeds. Canned beans with added salt. Canned or smoked fish. Whole eggs or egg yolks. Chicken or turkey with skin. Dairy Whole or 2% milk, cream, and half-and-half. Whole or full-fat cream cheese. Whole-fat or sweetened yogurt. Full-fat cheese. Nondairy creamers. Whipped toppings.  Processed cheese and cheese spreads. Fats and oils Butter. Stick margarine. Lard. Shortening. Ghee. Bacon fat. Tropical oils, such as coconut, palm kernel, or palm oil. Seasoning and other foods Salted popcorn and pretzels. Onion salt, garlic salt, seasoned salt, table salt, and sea salt. Worcestershire sauce. Tartar sauce. Barbecue sauce. Teriyaki sauce. Soy sauce, including reduced-sodium. Steak sauce. Canned and packaged gravies. Fish sauce. Oyster sauce. Cocktail sauce. Horseradish that you find on the shelf. Ketchup. Mustard. Meat flavorings and tenderizers. Bouillon cubes. Hot sauce and Tabasco sauce. Premade or packaged marinades. Premade or packaged taco seasonings. Relishes. Regular salad dressings. Where to find more information:  National Heart, Lung, and Blood Institute: www.nhlbi.nih.gov  American Heart Association: www.heart.org Summary  The DASH eating plan is a healthy eating plan that has been shown to reduce high blood pressure (hypertension). It may also reduce your risk for type 2 diabetes, heart disease, and stroke.  With the DASH eating plan, you should limit salt (sodium) intake to 2,300 mg a day. If you have hypertension, you may need to reduce your sodium intake to 1,500 mg a day.  When on the DASH eating plan, aim to eat more fresh fruits and vegetables, whole grains, lean proteins, low-fat dairy, and heart-healthy fats.  Work with your health care provider or diet and nutrition specialist (dietitian) to adjust your eating plan to your individual   calorie needs. This information is not intended to replace advice given to you by your health care provider. Make sure you discuss any questions you have with your health care provider. Document Released: 10/25/2011 Document Revised: 10/29/2016 Document Reviewed: 10/29/2016 Elsevier Interactive Patient Education  2017 [employer] Inc.  

## 2017-09-11 ENCOUNTER — Encounter: Payer: Self-pay | Admitting: Family Medicine

## 2017-09-11 LAB — BASIC METABOLIC PANEL
BUN / CREAT RATIO: 11 — AB (ref 12–28)
BUN: 10 mg/dL (ref 8–27)
CO2: 27 mmol/L (ref 20–29)
CREATININE: 0.9 mg/dL (ref 0.57–1.00)
Calcium: 9.7 mg/dL (ref 8.7–10.3)
Chloride: 101 mmol/L (ref 96–106)
GFR calc non Af Amer: 66 mL/min/{1.73_m2} (ref >59)
GFR, EST AFRICAN AMERICAN: 77 mL/min/{1.73_m2} (ref >59)
Glucose: 86 mg/dL (ref 65–99)
POTASSIUM: 4 mmol/L (ref 3.5–5.2)
SODIUM: 143 mmol/L (ref 134–144)

## 2018-03-11 ENCOUNTER — Ambulatory Visit (INDEPENDENT_AMBULATORY_CARE_PROVIDER_SITE_OTHER): Payer: Medicare Other | Admitting: Nurse Practitioner

## 2018-03-11 ENCOUNTER — Encounter: Payer: Self-pay | Admitting: Nurse Practitioner

## 2018-03-11 VITALS — BP 128/88 | Ht 65.5 in | Wt 203.4 lb

## 2018-03-11 DIAGNOSIS — Z01419 Encounter for gynecological examination (general) (routine) without abnormal findings: Secondary | ICD-10-CM

## 2018-03-11 DIAGNOSIS — Z0000 Encounter for general adult medical examination without abnormal findings: Secondary | ICD-10-CM | POA: Diagnosis not present

## 2018-03-11 NOTE — Progress Notes (Signed)
   Subjective:    Patient ID: Alicia Wu, female    DOB: 09-Oct-1951, 67 y.o.   MRN: [id number]  HPI Presents for her wellness exam. Has had an abdominal hysterectomy. Still has her ovaries. No pelvic pain or discharge. Her husband died a few years ago. No new sexual partners. Very active lifestyle including regular walking. Regular vision and dental exams.     Review of Systems  Constitutional: Negative for activity change, appetite change and fatigue.  HENT: Negative for dental problem, ear pain, sinus pressure and sore throat.   Respiratory: Negative for cough, chest tightness, shortness of breath and wheezing.   Cardiovascular: Negative for chest pain.  Gastrointestinal: Negative for abdominal distention, abdominal pain, blood in stool, constipation, diarrhea, nausea and vomiting.  Genitourinary: Negative for difficulty urinating, dysuria, enuresis, frequency, genital sores, pelvic pain, urgency and vaginal discharge.       Objective:   Physical Exam  Constitutional: She is oriented to person, place, and time. She appears well-developed. No distress.  HENT:  Right Ear: External ear normal.  Left Ear: External ear normal.  Mouth/Throat: Oropharynx is clear and moist.  Neck: Normal range of motion. Neck supple. No tracheal deviation present. No thyromegaly present.  Cardiovascular: Normal rate, regular rhythm and normal heart sounds. Exam reveals no gallop.  No murmur heard. Carotids no bruits or thrills.   Pulmonary/Chest: Effort normal and breath sounds normal. Right breast exhibits no inverted nipple, no mass, no skin change and no tenderness. Left breast exhibits no inverted nipple, no mass, no skin change and no tenderness. Breasts are symmetrical.  Axillae no adenopathy.   Abdominal: Soft. She exhibits no distension. There is no tenderness.  Genitourinary: Vagina normal. No vaginal discharge found.  Genitourinary Comments: External GU: no rashes or lesions. Vagina: minimal  white mucoid discharge. Bimanual exam: no tenderness or obvious masses but exam limited due to abdominal girth.   Lymphadenopathy:    She has no cervical adenopathy.  Neurological: She is alert and oriented to person, place, and time.  Skin: Skin is warm and dry. No rash noted.  Psychiatric: She has a normal mood and affect. Her behavior is normal.  Vitals reviewed.         Assessment & Plan:  Well woman exam  Continue daily MVI for women over 50 which has 1000 units of vitamin D. Continue activity and weight loss efforts. Follow up with Dr. Lorin PicketScott as planned for chronic health issues. Return in about 1 year (around 03/12/2019) for physical.

## 2018-03-12 ENCOUNTER — Ambulatory Visit: Payer: Medicare Other | Admitting: Family Medicine

## 2018-03-13 ENCOUNTER — Ambulatory Visit (INDEPENDENT_AMBULATORY_CARE_PROVIDER_SITE_OTHER): Payer: Medicare Other | Admitting: Family Medicine

## 2018-03-13 ENCOUNTER — Encounter: Payer: Self-pay | Admitting: Family Medicine

## 2018-03-13 VITALS — BP 134/82 | Ht 66.0 in | Wt 202.0 lb

## 2018-03-13 DIAGNOSIS — Z79899 Other long term (current) drug therapy: Secondary | ICD-10-CM

## 2018-03-13 DIAGNOSIS — E7849 Other hyperlipidemia: Secondary | ICD-10-CM

## 2018-03-13 DIAGNOSIS — I10 Essential (primary) hypertension: Secondary | ICD-10-CM | POA: Diagnosis not present

## 2018-03-13 MED ORDER — TRIAMTERENE-HCTZ 37.5-25 MG PO TABS: 1.0000 | ORAL_TABLET | Freq: Every day | ORAL | 1 refills | Status: DC

## 2018-03-13 MED ORDER — PRAVASTATIN SODIUM 40 MG PO TABS: 40.0000 mg | ORAL_TABLET | Freq: Every evening | ORAL | 2 refills | Status: DC

## 2018-03-13 MED ORDER — AMLODIPINE BESYLATE 10 MG PO TABS: ORAL_TABLET | ORAL | 1 refills | Status: DC

## 2018-03-13 NOTE — Progress Notes (Signed)
   Subjective:    Patient ID: Alicia Wu, female    DOB: [birth year], 67 y.o.   MRN: [id number]  HPI    Review of Systems     Objective:   Physical Exam        Assessment & Plan:

## 2018-03-13 NOTE — Progress Notes (Signed)
   Subjective:    Patient ID: Alicia Wu, female    DOB: 28-Sep-1951, 67 y.o.   MRN: [id number]  Hypertension  This is a chronic problem. Pertinent negatives include no chest pain. Treatments tried: amlodipine, triamterene/hctz. There are no compliance problems (eats healthy and exercises on most days. takes meds every day).   Overall patient doing well taking her medication.  Watching her diet.  Has gained some weight because she has not been exercising.  Moods are doing good. No concerns today.    Review of Systems  Constitutional: Negative for activity change, appetite change and fatigue.  HENT: Negative for congestion.   Respiratory: Negative for cough.   Cardiovascular: Negative for chest pain.  Gastrointestinal: Negative for abdominal pain.  Endocrine: Negative for polydipsia and polyphagia.  Skin: Negative for color change.  Neurological: Negative for weakness.  Psychiatric/Behavioral: Negative for confusion.       Objective:   Physical Exam  Constitutional: She appears well-developed and well-nourished. No distress.  HENT:  Head: Normocephalic and atraumatic.  Eyes: Right eye exhibits no discharge. Left eye exhibits no discharge.  Neck: No tracheal deviation present.  Cardiovascular: Normal rate, regular rhythm and normal heart sounds.  No murmur heard. Pulmonary/Chest: Effort normal and breath sounds normal. No respiratory distress. She has no wheezes. She has no rales.  Musculoskeletal: She exhibits no edema.  Lymphadenopathy:    She has no cervical adenopathy.  Neurological: She is alert. She exhibits normal muscle tone.  Skin: Skin is warm and dry. No erythema.  Psychiatric: Her behavior is normal.  Vitals reviewed.         Assessment & Plan:  HTN- Patient was seen today as part of a visit regarding hypertension. The importance of healthy diet and regular physical activity was discussed. The importance of compliance with medications discussed.  Ideal goal  is to keep blood pressure low elevated levels certainly below 140/90 when possible.  The patient was counseled that keeping blood pressure under control lessen his risk of heart attack, stroke, kidney failure, and early death.  The importance of regular follow-ups was discussed with the patient.  Low-salt diet such as DASH recommended. Regular physical activity was recommended as well.  Patient was advised to keep regular follow-ups.  The patient was seen today as part of an evaluation regarding hyperlipidemia.  Recent lab work has been reviewed with the patient as well as the goals for good cholesterol care.  In addition to this medications have been discussed the importance of compliance with diet and medications discussed as well.  Finally the patient is aware that poor control of cholesterol, noncompliance can dramatically increase her risk of heart attack strokes and premature death.  The patient will keep regular office visits and the patient does agreed to periodic lab work.  Patient will do her lab work today.  She will follow-up in 6 months she will try to lose at least 10 pounds over the next 6 months by exercising more often watching her diet

## 2018-03-14 LAB — BASIC METABOLIC PANEL
BUN/Creatinine Ratio: 13 (ref 12–28)
BUN: 14 mg/dL (ref 8–27)
CALCIUM: 10.3 mg/dL (ref 8.7–10.3)
CHLORIDE: 102 mmol/L (ref 96–106)
CO2: 27 mmol/L (ref 20–29)
CREATININE: 1.12 mg/dL — AB (ref 0.57–1.00)
GFR calc Af Amer: 59 mL/min/{1.73_m2} — ABNORMAL LOW (ref >59)
GFR, EST NON AFRICAN AMERICAN: 51 mL/min/{1.73_m2} — AB (ref >59)
Glucose: 90 mg/dL (ref 65–99)
Potassium: 3.9 mmol/L (ref 3.5–5.2)
Sodium: 143 mmol/L (ref 134–144)

## 2018-03-14 LAB — HEPATIC FUNCTION PANEL
ALK PHOS: 76 IU/L (ref 39–117)
ALT: 21 IU/L (ref 0–32)
AST: 26 IU/L (ref 0–40)
Albumin: 4.7 g/dL (ref 3.6–4.8)
BILIRUBIN TOTAL: 0.4 mg/dL (ref 0.0–1.2)
BILIRUBIN, DIRECT: 0.11 mg/dL (ref 0.00–0.40)
Total Protein: 7.6 g/dL (ref 6.0–8.5)

## 2018-03-14 LAB — LIPID PANEL
CHOLESTEROL TOTAL: 205 mg/dL — AB (ref 100–199)
Chol/HDL Ratio: 2.6 ratio (ref 0.0–4.4)
HDL: 79 mg/dL (ref >39)
LDL Calculated: 112 mg/dL — ABNORMAL HIGH (ref 0–99)
TRIGLYCERIDES: 72 mg/dL (ref 0–149)
VLDL Cholesterol Cal: 14 mg/dL (ref 5–40)

## 2018-03-17 ENCOUNTER — Other Ambulatory Visit: Payer: Self-pay | Admitting: *Deleted

## 2018-03-17 DIAGNOSIS — I10 Essential (primary) hypertension: Secondary | ICD-10-CM

## 2018-03-17 DIAGNOSIS — E785 Hyperlipidemia, unspecified: Secondary | ICD-10-CM

## 2018-04-25 ENCOUNTER — Other Ambulatory Visit: Payer: Self-pay | Admitting: Nurse Practitioner

## 2018-04-25 DIAGNOSIS — Z1231 Encounter for screening mammogram for malignant neoplasm of breast: Secondary | ICD-10-CM

## 2018-06-04 ENCOUNTER — Ambulatory Visit (HOSPITAL_COMMUNITY)
Admission: RE | Admit: 2018-06-04 | Discharge: 2018-06-04 | Disposition: A | Discharge status: Home / Self Care | Payer: Medicare Other | Source: Ambulatory Visit | Attending: Nurse Practitioner | Admitting: Nurse Practitioner

## 2018-06-04 DIAGNOSIS — Z1231 Encounter for screening mammogram for malignant neoplasm of breast: Secondary | ICD-10-CM | POA: Diagnosis not present

## 2018-06-09 ENCOUNTER — Encounter: Payer: Self-pay | Admitting: Family Medicine

## 2018-06-09 ENCOUNTER — Ambulatory Visit (INDEPENDENT_AMBULATORY_CARE_PROVIDER_SITE_OTHER): Payer: Medicare Other | Admitting: Family Medicine

## 2018-06-09 VITALS — BP 120/80 | Temp 98.5°F | Ht 66.0 in | Wt 190.0 lb

## 2018-06-09 DIAGNOSIS — L239 Allergic contact dermatitis, unspecified cause: Secondary | ICD-10-CM

## 2018-06-09 MED ORDER — PREDNISONE 20 MG PO TABS: ORAL_TABLET | ORAL | 0 refills | Status: DC

## 2018-06-09 NOTE — Patient Instructions (Signed)
DASH Eating Plan DASH stands for "Dietary Approaches to Stop Hypertension." The DASH eating plan is a healthy eating plan that has been shown to reduce high blood pressure (hypertension). It may also reduce your risk for type 2 diabetes, heart disease, and stroke. The DASH eating plan may also help with weight loss. What are tips for following this plan? General guidelines  Avoid eating more than 2,300 mg (milligrams) of salt (sodium) a day. If you have hypertension, you may need to reduce your sodium intake to 1,500 mg a day.  Limit alcohol intake to no more than 1 drink a day for nonpregnant women and 2 drinks a day for men. One drink equals 12 oz of beer, 5 oz of wine, or 1 oz of hard liquor.  Work with your health care provider to maintain a healthy body weight or to lose weight. Ask what an ideal weight is for you.  Get at least 30 minutes of exercise that causes your heart to beat faster (aerobic exercise) most days of the week. Activities may include walking, swimming, or biking.  Work with your health care provider or diet and nutrition specialist (dietitian) to adjust your eating plan to your individual calorie needs. Reading food labels  Check food labels for the amount of sodium per serving. Choose foods with less than 5 percent of the Daily Value of sodium. Generally, foods with less than 300 mg of sodium per serving fit into this eating plan.  To find whole grains, look for the word "whole" as the first word in the ingredient list. Shopping  Buy products labeled as "low-sodium" or "no salt added."  Buy fresh foods. Avoid canned foods and premade or frozen meals. Cooking  Avoid adding salt when cooking. Use salt-free seasonings or herbs instead of table salt or sea salt. Check with your health care provider or pharmacist before using salt substitutes.  Do not fry foods. Cook foods using healthy methods such as baking, boiling, grilling, and broiling instead.  Cook with  heart-healthy oils, such as olive, canola, soybean, or sunflower oil. Meal planning   Eat a balanced diet that includes: ? 5 or more servings of fruits and vegetables each day. At each meal, try to fill half of your plate with fruits and vegetables. ? Up to 6-8 servings of whole grains each day. ? Less than 6 oz of lean meat, poultry, or fish each day. A 3-oz serving of meat is about the same size as a deck of cards. One egg equals 1 oz. ? 2 servings of low-fat dairy each day. ? A serving of nuts, seeds, or beans 5 times each week. ? Heart-healthy fats. Healthy fats called Omega-3 fatty acids are found in foods such as flaxseeds and coldwater fish, like sardines, salmon, and mackerel.  Limit how much you eat of the following: ? Canned or prepackaged foods. ? Food that is high in trans fat, such as fried foods. ? Food that is high in saturated fat, such as fatty meat. ? Sweets, desserts, sugary drinks, and other foods with added sugar. ? Full-fat dairy products.  Do not salt foods before eating.  Try to eat at least 2 vegetarian meals each week.  Eat more home-cooked food and less restaurant, buffet, and fast food.  When eating at a restaurant, ask that your food be prepared with less salt or no salt, if possible. What foods are recommended? The items listed may not be a complete list. Talk with your dietitian about what   dietary choices are best for you. Grains Whole-grain or whole-wheat bread. Whole-grain or whole-wheat pasta. Brown rice. Oatmeal. Quinoa. Bulgur. Whole-grain and low-sodium cereals. Pita bread. Low-fat, low-sodium crackers. Whole-wheat flour tortillas. Vegetables Fresh or frozen vegetables (raw, steamed, roasted, or grilled). Low-sodium or reduced-sodium tomato and vegetable juice. Low-sodium or reduced-sodium tomato sauce and tomato paste. Low-sodium or reduced-sodium canned vegetables. Fruits All fresh, dried, or frozen fruit. Canned fruit in natural juice (without  added sugar). Meat and other protein foods Skinless chicken or turkey. Ground chicken or turkey. Pork with fat trimmed off. Fish and seafood. Egg whites. Dried beans, peas, or lentils. Unsalted nuts, nut butters, and seeds. Unsalted canned beans. Lean cuts of beef with fat trimmed off. Low-sodium, lean deli meat. Dairy Low-fat (1%) or fat-free (skim) milk. Fat-free, low-fat, or reduced-fat cheeses. Nonfat, low-sodium ricotta or cottage cheese. Low-fat or nonfat yogurt. Low-fat, low-sodium cheese. Fats and oils Soft margarine without trans fats. Vegetable oil. Low-fat, reduced-fat, or light mayonnaise and salad dressings (reduced-sodium). Canola, safflower, olive, soybean, and sunflower oils. Avocado. Seasoning and other foods Herbs. Spices. Seasoning mixes without salt. Unsalted popcorn and pretzels. Fat-free sweets. What foods are not recommended? The items listed may not be a complete list. Talk with your dietitian about what dietary choices are best for you. Grains Baked goods made with fat, such as croissants, muffins, or some breads. Dry pasta or rice meal packs. Vegetables Creamed or fried vegetables. Vegetables in a cheese sauce. Regular canned vegetables (not low-sodium or reduced-sodium). Regular canned tomato sauce and paste (not low-sodium or reduced-sodium). Regular tomato and vegetable juice (not low-sodium or reduced-sodium). Pickles. Olives. Fruits Canned fruit in a light or heavy syrup. Fried fruit. Fruit in cream or butter sauce. Meat and other protein foods Fatty cuts of meat. Ribs. Fried meat. Bacon. Sausage. Bologna and other processed lunch meats. Salami. Fatback. Hotdogs. Bratwurst. Salted nuts and seeds. Canned beans with added salt. Canned or smoked fish. Whole eggs or egg yolks. Chicken or turkey with skin. Dairy Whole or 2% milk, cream, and half-and-half. Whole or full-fat cream cheese. Whole-fat or sweetened yogurt. Full-fat cheese. Nondairy creamers. Whipped toppings.  Processed cheese and cheese spreads. Fats and oils Butter. Stick margarine. Lard. Shortening. Ghee. Bacon fat. Tropical oils, such as coconut, palm kernel, or palm oil. Seasoning and other foods Salted popcorn and pretzels. Onion salt, garlic salt, seasoned salt, table salt, and sea salt. Worcestershire sauce. Tartar sauce. Barbecue sauce. Teriyaki sauce. Soy sauce, including reduced-sodium. Steak sauce. Canned and packaged gravies. Fish sauce. Oyster sauce. Cocktail sauce. Horseradish that you find on the shelf. Ketchup. Mustard. Meat flavorings and tenderizers. Bouillon cubes. Hot sauce and Tabasco sauce. Premade or packaged marinades. Premade or packaged taco seasonings. Relishes. Regular salad dressings. Where to find more information:  National Heart, Lung, and Blood Institute: www.nhlbi.nih.gov  American Heart Association: www.heart.org Summary  The DASH eating plan is a healthy eating plan that has been shown to reduce high blood pressure (hypertension). It may also reduce your risk for type 2 diabetes, heart disease, and stroke.  With the DASH eating plan, you should limit salt (sodium) intake to 2,300 mg a day. If you have hypertension, you may need to reduce your sodium intake to 1,500 mg a day.  When on the DASH eating plan, aim to eat more fresh fruits and vegetables, whole grains, lean proteins, low-fat dairy, and heart-healthy fats.  Work with your health care provider or diet and nutrition specialist (dietitian) to adjust your eating plan to your individual   calorie needs. This information is not intended to replace advice given to you by your health care provider. Make sure you discuss any questions you have with your health care provider. Document Released: 10/25/2011 Document Revised: 10/29/2016 Document Reviewed: 10/29/2016 Elsevier Interactive Patient Education  2018 [employer] Inc.  

## 2018-06-09 NOTE — Progress Notes (Signed)
   Subjective:    Patient ID: Alicia Wu, female    DOB: [birth year], 67 y.o.   MRN: [id number]  HPI Patient is here today to follow up on right arm swelling and blistered.She says it started itching yesterday,but it started swelling and blistering this am. She states this has happened in the past and it will get worse with no treatment. She has 2 separate areas on her right arm which are swollen no tenderness no pain or discomfort does have some blistering with  Review of Systems Denies fever chills sweats pain discomfort denies any injury.  Is outside some    Objective:   Physical Exam Both areas on the arms have some thickening of the skin along with redness consistent with angioedema with some blistering of it consistent with a contact dermatitis       Assessment & Plan:  Prednisone taper Caution with eating If ongoing troubles or other problems follow-up Allergic dermatitis

## 2018-06-20 DIAGNOSIS — E785 Hyperlipidemia, unspecified: Secondary | ICD-10-CM | POA: Diagnosis not present

## 2018-06-20 DIAGNOSIS — I10 Essential (primary) hypertension: Secondary | ICD-10-CM | POA: Diagnosis not present

## 2018-06-21 ENCOUNTER — Encounter: Payer: Self-pay | Admitting: Family Medicine

## 2018-06-21 LAB — BASIC METABOLIC PANEL
BUN/Creatinine Ratio: 13 (ref 12–28)
BUN: 15 mg/dL (ref 8–27)
CO2: 26 mmol/L (ref 20–29)
Calcium: 9.5 mg/dL (ref 8.7–10.3)
Chloride: 102 mmol/L (ref 96–106)
Creatinine, Ser: 1.16 mg/dL — ABNORMAL HIGH (ref 0.57–1.00)
GFR calc Af Amer: 56 mL/min/{1.73_m2} — ABNORMAL LOW (ref >59)
GFR calc non Af Amer: 48 mL/min/{1.73_m2} — ABNORMAL LOW (ref >59)
GLUCOSE: 83 mg/dL (ref 65–99)
POTASSIUM: 3.7 mmol/L (ref 3.5–5.2)
SODIUM: 143 mmol/L (ref 134–144)

## 2018-06-21 LAB — LIPID PANEL
CHOLESTEROL TOTAL: 179 mg/dL (ref 100–199)
Chol/HDL Ratio: 1.9 ratio (ref 0.0–4.4)
HDL: 93 mg/dL (ref >39)
LDL Calculated: 71 mg/dL (ref 0–99)
TRIGLYCERIDES: 73 mg/dL (ref 0–149)
VLDL Cholesterol Cal: 15 mg/dL (ref 5–40)

## 2018-06-24 ENCOUNTER — Ambulatory Visit (INDEPENDENT_AMBULATORY_CARE_PROVIDER_SITE_OTHER): Payer: Medicare Other | Admitting: Family Medicine

## 2018-06-24 ENCOUNTER — Telehealth: Payer: Self-pay | Admitting: Family Medicine

## 2018-06-24 ENCOUNTER — Encounter: Payer: Self-pay | Admitting: Family Medicine

## 2018-06-24 VITALS — BP 128/84 | Temp 99.1°F | Ht 66.0 in | Wt 190.2 lb

## 2018-06-24 DIAGNOSIS — L239 Allergic contact dermatitis, unspecified cause: Secondary | ICD-10-CM | POA: Diagnosis not present

## 2018-06-24 DIAGNOSIS — L03116 Cellulitis of left lower limb: Secondary | ICD-10-CM | POA: Diagnosis not present

## 2018-06-24 MED ORDER — PREDNISONE 20 MG PO TABS: ORAL_TABLET | ORAL | 0 refills | Status: DC

## 2018-06-24 MED ORDER — METHYLPREDNISOLONE ACETATE 40 MG/ML IJ SUSP
40.0000 mg | Freq: Once | INTRAMUSCULAR | Status: AC
  Administered 2018-06-24: 40 mg via INTRAMUSCULAR

## 2018-06-24 MED ORDER — DOXYCYCLINE HYCLATE 100 MG PO CAPS: 100.0000 mg | ORAL_CAPSULE | Freq: Two times a day (BID) | ORAL | 0 refills | Status: DC

## 2018-06-24 NOTE — Telephone Encounter (Signed)
Seen 7/22 for allergic dermatitis

## 2018-06-24 NOTE — Progress Notes (Signed)
   Subjective:    Patient ID: Alicia Wu, female    DOB: 1951-08-24, 67 y.o.   MRN: [id number]  HPI Pt here today due to left thigh being swollen and red. Started Sunday. Pt states the area is hot to the touch. Pt states that this happens each year.  This patient relates that she had a bump on the back of her left thigh then it became red and it spread then started having blisters started itching and became real thick she had this happen last summer and did not have it at other times and she had it happened about a week or so ago on her arm she was treated with prednisone and it got better she denies any fever chills she states she does get outside and works in the yard but she denies any known exposures denies any new medications or soaps  Review of Systems Denies high fever chills sweats wheezing difficulty breathing nausea vomiting diarrhea    Objective:   Physical Exam Lungs clear respiratory rate normal heart is regular no murmurs back of left leg has a erythematous area now relatively large with some blisters around  15 minutes was spent with patient today discussing healthcare issues which they came.  More than 50% of this visit-total duration of visit-was spent in counseling and coordination of care.  Please see diagnosis regarding the focus of this coordination and care      Assessment & Plan:  This appears to be more of a contact dermatitis with localized blistering there is possibility of cellulitis we will go with an antibiotic for several days along with prednisone and a Depo-Medrol shot she will follow-up if progressive troubles or if worse

## 2018-06-24 NOTE — Telephone Encounter (Signed)
Pt is now having swelling in her thigh and blisters. She is wanting to know if something can be called in to [id number] - [place], [place] - [address] [facility] or if she needs to come in and have Dr. Lorin PicketScott look at it. The medicine cleared her arm up but yesterday the swelling and blisters started.

## 2018-06-24 NOTE — Telephone Encounter (Signed)
Would recommend for the patient to go ahead and have an office visit later this afternoon

## 2018-06-24 NOTE — Telephone Encounter (Signed)
Discussed with pt. Pt transferred to front to schedule an office visit this afternoon.

## 2018-06-27 ENCOUNTER — Telehealth: Payer: Self-pay | Admitting: Family Medicine

## 2018-06-27 ENCOUNTER — Other Ambulatory Visit: Payer: Self-pay | Admitting: Family Medicine

## 2018-06-27 DIAGNOSIS — I10 Essential (primary) hypertension: Secondary | ICD-10-CM

## 2018-06-27 MED ORDER — TRIAMTERENE 50 MG PO CAPS: ORAL_CAPSULE | ORAL | 3 refills | Status: DC

## 2018-06-27 NOTE — Telephone Encounter (Signed)
Discussed with pt. Pt verbalized understanding. Orders put in and mailed to pt as a reminder to do in 2-3 weeks.

## 2018-06-27 NOTE — Telephone Encounter (Signed)
Discussed with pt. Pt verbalized understanding.  °

## 2018-06-27 NOTE — Telephone Encounter (Signed)
Please let Alicia HongJudy know that I am glad she is doing better It is true that all 3 of these medications can be associated with swelling but it is important for her to realize that the type of swelling there associated with is generally pedal edema and not the swelling issues she was having If she has further swelling issues I would like for her to follow-up

## 2018-06-27 NOTE — Telephone Encounter (Signed)
Patient said that her Rx for triamterene-HCTZ is in the sulfa family and she is allergic to sulfa.  She said she wants to have this medication changed to something else because she thinks its causing her to have the blisters.   [facility]

## 2018-06-27 NOTE — Telephone Encounter (Signed)
Pt states she thanks you for your patience but at this time she is unable to come for an office visit. She states that every time she has an OV there is money involved and at this point she is struggling. Pt sounded tearful toward end of phone conversation. Pt did state that she will stop the medication and see what happens. Pt states she will call office if she develops problems while being off medication.

## 2018-06-27 NOTE — Telephone Encounter (Signed)
Please let Alicia Wu know our goal is to try to help her She can stop the diuretic that included HCTZ I really issued a new diuretic that is just triamterene which does not have a sulfonamide molecule on it She is to take 1 each morning She should follow her blood pressure if she has trouble I would like for her to follow-up Plus she should check a metabolic 7 in approximately 2 to 3 weeks to make sure that her potassium is staying stable Hopefully all of this will help Otherwise she should follow-up later in the fall

## 2018-06-27 NOTE — Telephone Encounter (Signed)
I certainly respect the patient's input It is not as simple as she makes it sound The patient believes that all sulfa medicines are the same-they are not Respecting the patient's wishes she may stop her diuretic The patient will need to do a follow-up visit next week to discuss this issue in detail Then we can discuss other options for her to use

## 2018-06-27 NOTE — Telephone Encounter (Signed)
Patient seen Dr. Arline AspScot on 06/24/18.  She wanted to let Dr. Lorin PicketScott know that she is doing much better, swelling is going down, blisters are drying up.  She said she looked at the side effects of Amlodipine, triamterine-hydrochlorothiazide and pravastatin and they all include swelling.

## 2018-06-28 ENCOUNTER — Telehealth: Payer: Self-pay | Admitting: Family Medicine

## 2018-06-28 NOTE — Telephone Encounter (Signed)
Patient is frustrated by the level of issues she is having and is stressed about changing diuretic concerned that triamterene could cause potassium to change  She relates that years ago she was taking HCTZ and had a problem with that- therefore she does not want to take those diuretics  She has opted to watch her diet stay active and use her amlodipine and monitor her blood pressure she will let us know in a few weeks  Nurses- in her epic medication list please note that the patient is currently holding triamterene and she will let us know if she starts the medication, she will do her follow-up visit later in the fall

## 2018-06-30 ENCOUNTER — Telehealth: Payer: Self-pay | Admitting: Family Medicine

## 2018-06-30 NOTE — Telephone Encounter (Signed)
Patient states you are right and she was going to hold of on the triamterene for now.

## 2018-06-30 NOTE — Telephone Encounter (Signed)
I had a conversation with the patient on Saturday At that time she stated that she was going to hold off on the triamterene-therefore triamterene was put on hold She was just going to do amlodipine and follow her blood pressure Please talk with the patient see what has changed in regards to either how she is doing or her decisions

## 2018-06-30 NOTE — Telephone Encounter (Signed)
[facility] and the do not have the authorization for there to receive the triamterene (DYRENIUM) 50 MG capsule

## 2018-06-30 NOTE — Telephone Encounter (Signed)
I made a note attached to the medication stating it was being held by the pt and if resumes she will notify us.

## 2018-06-30 NOTE — Telephone Encounter (Signed)
This was just placed on hold. Does she need this?

## 2018-07-03 ENCOUNTER — Ambulatory Visit: Payer: Medicare Other | Admitting: Family Medicine

## 2018-07-14 DIAGNOSIS — I10 Essential (primary) hypertension: Secondary | ICD-10-CM | POA: Diagnosis not present

## 2018-07-15 LAB — BASIC METABOLIC PANEL
BUN/Creatinine Ratio: 7 — ABNORMAL LOW (ref 12–28)
BUN: 8 mg/dL (ref 8–27)
CALCIUM: 9.9 mg/dL (ref 8.7–10.3)
CO2: 23 mmol/L (ref 20–29)
CREATININE: 1.11 mg/dL — AB (ref 0.57–1.00)
Chloride: 107 mmol/L — ABNORMAL HIGH (ref 96–106)
GFR, EST AFRICAN AMERICAN: 59 mL/min/{1.73_m2} — AB (ref >59)
GFR, EST NON AFRICAN AMERICAN: 51 mL/min/{1.73_m2} — AB (ref >59)
Glucose: 86 mg/dL (ref 65–99)
POTASSIUM: 3.9 mmol/L (ref 3.5–5.2)
SODIUM: 145 mmol/L — AB (ref 134–144)

## 2018-08-25 DIAGNOSIS — Z23 Encounter for immunization: Secondary | ICD-10-CM | POA: Diagnosis not present

## 2018-09-12 ENCOUNTER — Ambulatory Visit: Payer: Medicare Other | Admitting: Family Medicine

## 2018-10-07 ENCOUNTER — Other Ambulatory Visit: Payer: Self-pay

## 2018-12-25 ENCOUNTER — Ambulatory Visit: Payer: Medicare Other | Admitting: Family Medicine

## 2018-12-25 ENCOUNTER — Other Ambulatory Visit: Payer: Self-pay | Admitting: Family Medicine

## 2018-12-25 ENCOUNTER — Ambulatory Visit (INDEPENDENT_AMBULATORY_CARE_PROVIDER_SITE_OTHER): Payer: Medicare Other | Admitting: Family Medicine

## 2018-12-25 ENCOUNTER — Encounter: Payer: Self-pay | Admitting: Family Medicine

## 2018-12-25 VITALS — BP 128/78 | Ht 66.0 in | Wt 181.0 lb

## 2018-12-25 DIAGNOSIS — I10 Essential (primary) hypertension: Secondary | ICD-10-CM | POA: Diagnosis not present

## 2018-12-25 DIAGNOSIS — E7849 Other hyperlipidemia: Secondary | ICD-10-CM | POA: Diagnosis not present

## 2018-12-25 MED ORDER — AMLODIPINE BESYLATE 10 MG PO TABS: ORAL_TABLET | ORAL | 1 refills | Status: DC

## 2018-12-25 MED ORDER — PRAVASTATIN SODIUM 40 MG PO TABS: 40.0000 mg | ORAL_TABLET | Freq: Every evening | ORAL | 2 refills | Status: DC

## 2018-12-25 MED ORDER — TRIAMTERENE 50 MG PO CAPS: ORAL_CAPSULE | ORAL | 3 refills | Status: DC

## 2018-12-25 NOTE — Progress Notes (Signed)
Subjective:    Patient ID: Alicia Wu, female    DOB: 30-Mar-1951, 68 y.o.   MRN: [id number]  HPI Patient is here today to follow up on chronic illnesses  Hypertension: She takes Norvasc 10 mg once per day and She also takes Triamterene with Hctz she does not know the dosage, but it is not on her list. Her list states she takes plain Triamterene 50 mg one po Qd.  Hyperlipidemia: She takes Pravachol 40 mg one per day.  She eats healthy and exercises six days per week by walking.   She does not see any specialists.   Patient for blood pressure check up.  The patient does have hypertension.  The patient is on medication.  Patient relates compliance with meds. Todays BP reviewed with the patient. Patient denies issues with medication. Patient relates reasonable diet. Patient tries to minimize salt. Patient aware of BP goals.  Patient here for follow-up regarding cholesterol.  The patient does have hyperlipidemia.  Patient does try to maintain a reasonable diet.  Patient does take the medication on a regular basis.  Denies missing a dose.  The patient denies any obvious side effects.  Prior blood work results reviewed with the patient.  The patient is aware of his cholesterol goals and the need to keep it under good control to lessen the risk of disease.  Review of Systems  Constitutional: Negative for activity change, appetite change and fatigue.  HENT: Negative for congestion and rhinorrhea.   Respiratory: Negative for cough and shortness of breath.   Cardiovascular: Negative for chest pain and leg swelling.  Gastrointestinal: Negative for abdominal pain and diarrhea.  Endocrine: Negative for polydipsia and polyphagia.  Skin: Negative for color change.  Neurological: Negative for dizziness and weakness.  Psychiatric/Behavioral: Negative for behavioral problems and confusion.       Objective:   Physical Exam Vitals signs reviewed.  Constitutional:      General: She is not in  acute distress. HENT:     Head: Normocephalic and atraumatic.  Eyes:     General:        Right eye: No discharge.        Left eye: No discharge.  Neck:     Trachea: No tracheal deviation.  Cardiovascular:     Rate and Rhythm: Normal rate and regular rhythm.     Heart sounds: Normal heart sounds. No murmur.  Pulmonary:     Effort: Pulmonary effort is normal. No respiratory distress.     Breath sounds: Normal breath sounds.  Lymphadenopathy:     Cervical: No cervical adenopathy.  Skin:    General: Skin is warm and dry.  Neurological:     Mental Status: She is alert.     Coordination: Coordination normal.  Psychiatric:        Behavior: Behavior normal.           Assessment & Plan:  Blood pressure is doing well.  I had the let the patient sit for several minutes for it to come down-there is no reason to do additional medications  The patient was seen today as part of an evaluation regarding hyperlipidemia.  Recent lab work has been reviewed with the patient as well as the goals for good cholesterol care.  In addition to this medications have been discussed the importance of compliance with diet and medications discussed as well.  Finally the patient is aware that poor control of cholesterol, noncompliance can dramatically increase the risk of  complications. The patient will keep regular office visits and the patient does agreed to periodic lab work.  15 minutes was spent with patient today discussing healthcare issues which they came.  More than 50% of this visit-total duration of visit-was spent in counseling and coordination of care.  Please see diagnosis regarding the focus of this coordination and care  Patient has been trying to lose weight she has been successful at doing so continue healthy diet lab work ordered refills given follow-up by 6 months

## 2018-12-26 LAB — LIPID PANEL
CHOLESTEROL TOTAL: 235 mg/dL — AB (ref 100–199)
Chol/HDL Ratio: 2.5 ratio (ref 0.0–4.4)
HDL: 93 mg/dL (ref >39)
LDL Calculated: 130 mg/dL — ABNORMAL HIGH (ref 0–99)
Triglycerides: 59 mg/dL (ref 0–149)
VLDL CHOLESTEROL CAL: 12 mg/dL (ref 5–40)

## 2018-12-26 LAB — BASIC METABOLIC PANEL
BUN / CREAT RATIO: 10 — AB (ref 12–28)
BUN: 11 mg/dL (ref 8–27)
CHLORIDE: 99 mmol/L (ref 96–106)
CO2: 24 mmol/L (ref 20–29)
Calcium: 10.1 mg/dL (ref 8.7–10.3)
Creatinine, Ser: 1.07 mg/dL — ABNORMAL HIGH (ref 0.57–1.00)
GFR calc Af Amer: 62 mL/min/{1.73_m2} (ref >59)
GFR calc non Af Amer: 53 mL/min/{1.73_m2} — ABNORMAL LOW (ref >59)
Glucose: 82 mg/dL (ref 65–99)
POTASSIUM: 3.6 mmol/L (ref 3.5–5.2)
Sodium: 139 mmol/L (ref 134–144)

## 2019-02-04 ENCOUNTER — Telehealth: Payer: Self-pay | Admitting: *Deleted

## 2019-02-04 NOTE — Telephone Encounter (Signed)
Fax from [facility] Triamterene 50mg  capsule is $928. Patient request a cheaper alternative. Pharm did not provide alternatives.

## 2019-05-04 ENCOUNTER — Other Ambulatory Visit (HOSPITAL_COMMUNITY): Payer: Self-pay | Admitting: Family Medicine

## 2019-05-04 DIAGNOSIS — Z1231 Encounter for screening mammogram for malignant neoplasm of breast: Secondary | ICD-10-CM

## 2019-06-17 ENCOUNTER — Telehealth: Payer: Self-pay | Admitting: Family Medicine

## 2019-06-17 DIAGNOSIS — Z79899 Other long term (current) drug therapy: Secondary | ICD-10-CM

## 2019-06-17 DIAGNOSIS — E785 Hyperlipidemia, unspecified: Secondary | ICD-10-CM

## 2019-06-17 DIAGNOSIS — I10 Essential (primary) hypertension: Secondary | ICD-10-CM

## 2019-06-17 NOTE — Telephone Encounter (Signed)
Last labs 12/25/18 lipid, bmp

## 2019-06-17 NOTE — Telephone Encounter (Signed)
Metabolic 7, lipid, liver- hyperlipidemia hypertension

## 2019-06-17 NOTE — Telephone Encounter (Signed)
Patient has a 6 mos f/u appt 07-03-19 and she wants to know if she needs bloodwork prior to appt?

## 2019-06-18 NOTE — Telephone Encounter (Signed)
Orders put in. Pt notified.  

## 2019-06-25 ENCOUNTER — Ambulatory Visit: Payer: Medicare Other | Admitting: Family Medicine

## 2019-06-30 DIAGNOSIS — H401131 Primary open-angle glaucoma, bilateral, mild stage: Secondary | ICD-10-CM | POA: Diagnosis not present

## 2019-07-03 ENCOUNTER — Ambulatory Visit (INDEPENDENT_AMBULATORY_CARE_PROVIDER_SITE_OTHER): Payer: Medicare Other | Admitting: Family Medicine

## 2019-07-03 ENCOUNTER — Other Ambulatory Visit: Payer: Self-pay

## 2019-07-03 VITALS — BP 126/84

## 2019-07-03 DIAGNOSIS — I10 Essential (primary) hypertension: Secondary | ICD-10-CM

## 2019-07-03 DIAGNOSIS — E7849 Other hyperlipidemia: Secondary | ICD-10-CM | POA: Diagnosis not present

## 2019-07-03 MED ORDER — AMLODIPINE BESYLATE 10 MG PO TABS: ORAL_TABLET | ORAL | 1 refills | Status: DC

## 2019-07-03 MED ORDER — PRAVASTATIN SODIUM 40 MG PO TABS: 40.0000 mg | ORAL_TABLET | Freq: Every evening | ORAL | 1 refills | Status: DC

## 2019-07-03 NOTE — Progress Notes (Signed)
Subjective:    Patient ID: Alicia Wu, female    DOB: 07/06/1951, 68 y.o.   MRN: [id number]  Hypertension This is a chronic problem. The current episode started more than 1 year ago. Pertinent negatives include no chest pain or shortness of breath. Risk factors for coronary artery disease include dyslipidemia and post-menopausal state. Treatments tried: norvasc. There are no compliance problems.    Patient relates she no longer taking diuretic blood pressure staying under good control denies any major issues  Patient for blood pressure check up.  The patient does have hypertension.  The patient is on medication.  Patient relates compliance with meds. Todays BP reviewed with the patient. Patient denies issues with medication. Patient relates reasonable diet. Patient tries to minimize salt. Patient aware of BP goals.  Patient here for follow-up regarding cholesterol.  The patient does have hyperlipidemia.  Patient does try to maintain a reasonable diet.  Patient does take the medication on a regular basis.  Denies missing a dose.  The patient denies any obvious side effects.  Prior blood work results reviewed with the patient.  The patient is aware of his cholesterol goals and the need to keep it under good control to lessen the risk of disease.  Review of Systems  Constitutional: Negative for activity change, appetite change and fatigue.  HENT: Negative for congestion and rhinorrhea.   Respiratory: Negative for cough and shortness of breath.   Cardiovascular: Negative for chest pain and leg swelling.  Gastrointestinal: Negative for abdominal pain and diarrhea.  Endocrine: Negative for polydipsia and polyphagia.  Skin: Negative for color change.  Neurological: Negative for dizziness and weakness.  Psychiatric/Behavioral: Negative for behavioral problems and confusion.    Virtual Visit via Video Note  I connected with KIHANNA KAMIYA on 07/03/19 at 10:00 AM EDT by a video enabled  telemedicine application and verified that I am speaking with the correct person using two identifiers.  Location: Patient: home Provider: office   I discussed the limitations of evaluation and management by telemedicine and the availability of in person appointments. The patient expressed understanding and agreed to proceed.  History of Present Illness:    Observations/Objective:   Assessment and Plan:   Follow Up Instructions:    I discussed the assessment and treatment plan with the patient. The patient was provided an opportunity to ask questions and all were answered. The patient agreed with the plan and demonstrated an understanding of the instructions.   The patient was advised to call back or seek an in-person evaluation if the symptoms worsen or if the condition fails to improve as anticipated.  I provided 15 minutes of non-face-to-face time during this encounter.         Objective:    Today's visit was via telephone Physical exam was not possible for this visit         Assessment & Plan:  Blood pressure decent control with medicines watch diet no longer on the diuretic.  Follow-up for in person visit by springtime  Patient doing the best she can at minimizing risk of coronavirus her past several mammograms have been good she is looking at doing her mammogram in the spring which I think is reasonable  HTN- Patient was seen today as part of a visit regarding hypertension. The importance of healthy diet and regular physical activity was discussed. The importance of compliance with medications discussed.  Ideal goal is to keep blood pressure low elevated levels certainly below 220/25 when  possible.  The patient was counseled that keeping blood pressure under control lessen his risk of complications.  The importance of regular follow-ups was discussed with the patient.  Low-salt diet such as DASH recommended.  Regular physical activity was recommended as well.   Patient was advised to keep regular follow-ups.  The patient was seen today as part of an evaluation regarding hyperlipidemia.  Recent lab work has been reviewed with the patient as well as the goals for good cholesterol care.  In addition to this medications have been discussed the importance of compliance with diet and medications discussed as well.  Finally the patient is aware that poor control of cholesterol, noncompliance can dramatically increase the risk of complications. The patient will keep regular office visits and the patient does agreed to periodic lab work.  Lab work ordered but not absolutely necessary currently so therefore patient would like to put this off till spring that is fine

## 2019-07-20 ENCOUNTER — Ambulatory Visit (HOSPITAL_COMMUNITY)
Admission: RE | Admit: 2019-07-20 | Discharge: 2019-07-20 | Disposition: A | Discharge status: Home / Self Care | Payer: Medicare Other | Source: Ambulatory Visit | Attending: Family Medicine | Admitting: Family Medicine

## 2019-07-20 ENCOUNTER — Other Ambulatory Visit: Payer: Self-pay

## 2019-07-20 DIAGNOSIS — Z1231 Encounter for screening mammogram for malignant neoplasm of breast: Secondary | ICD-10-CM | POA: Diagnosis not present

## 2019-08-22 DIAGNOSIS — Z23 Encounter for immunization: Secondary | ICD-10-CM | POA: Diagnosis not present

## 2019-08-31 DIAGNOSIS — H401131 Primary open-angle glaucoma, bilateral, mild stage: Secondary | ICD-10-CM | POA: Diagnosis not present

## 2019-09-18 IMAGING — MG DIGITAL SCREENING BILATERAL MAMMOGRAM WITH TOMO AND CAD
8 series · 8 of 24 positions shown · non-contrast
Comparison: Previous exam(s).

CLINICAL DATA: Screening.

EXAM:
DIGITAL SCREENING BILATERAL MAMMOGRAM WITH TOMO AND CAD

[R MLO]
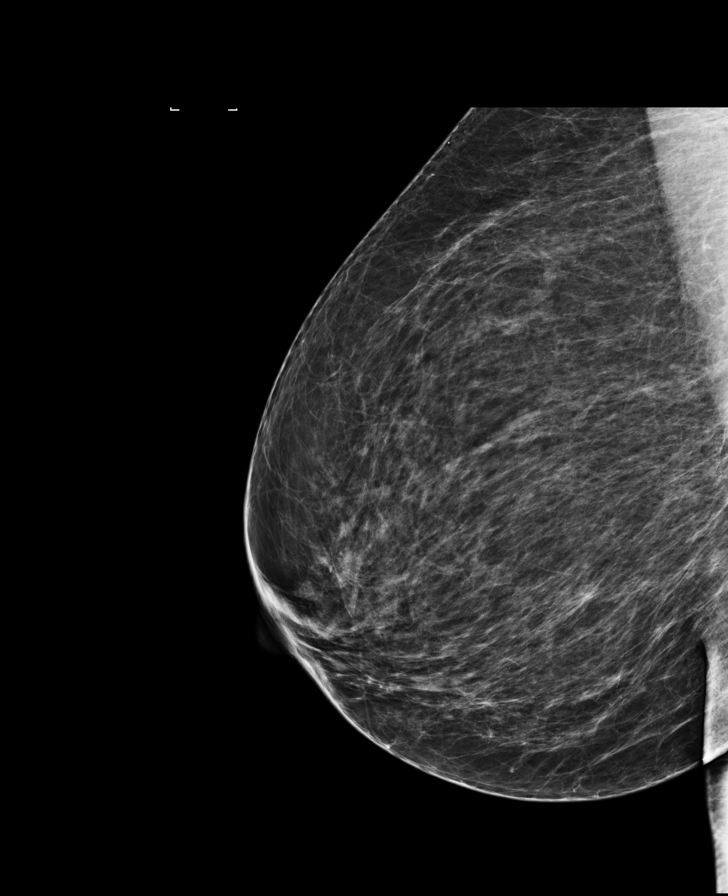

[R CC]
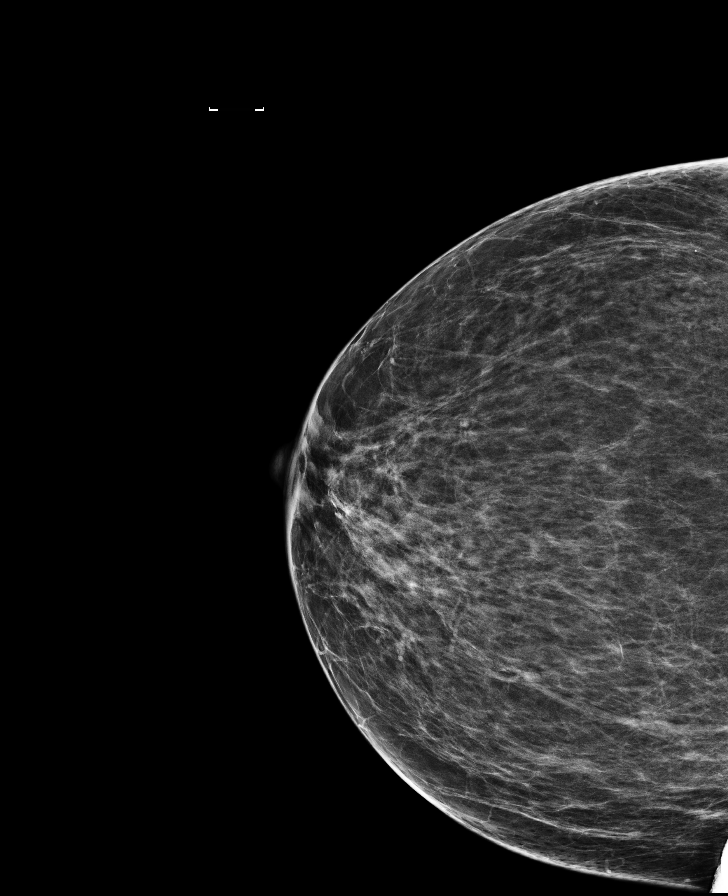

[L MLO]
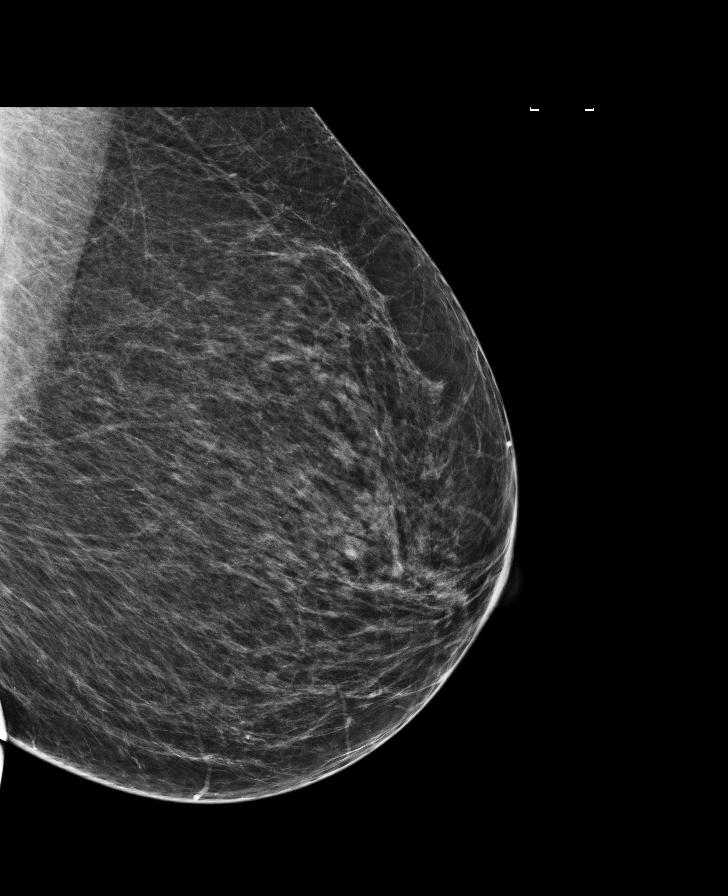

[L CC]
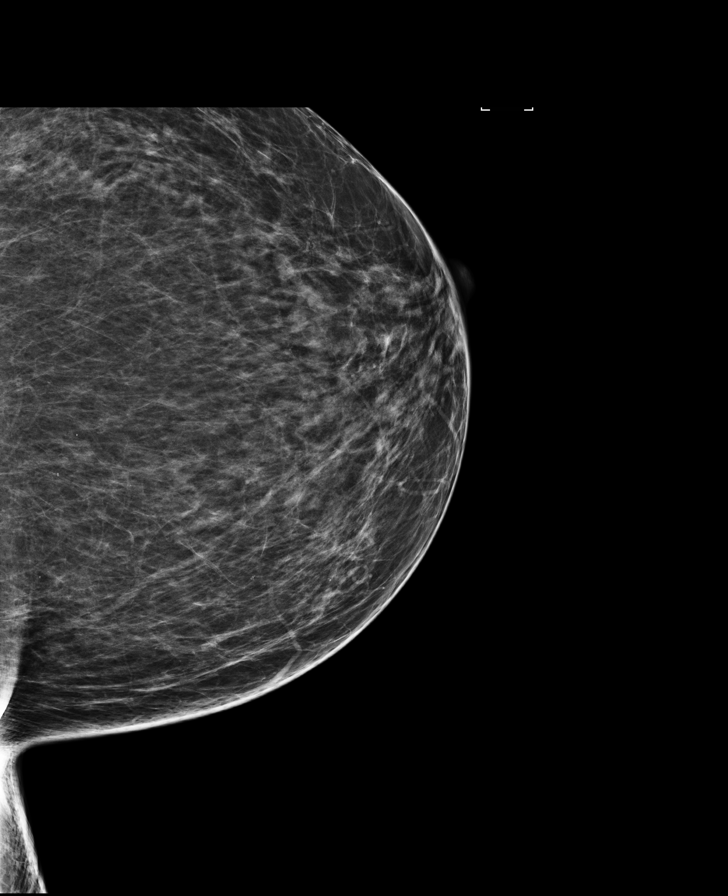

[L CC tomo · tomo slice 37/73.0]
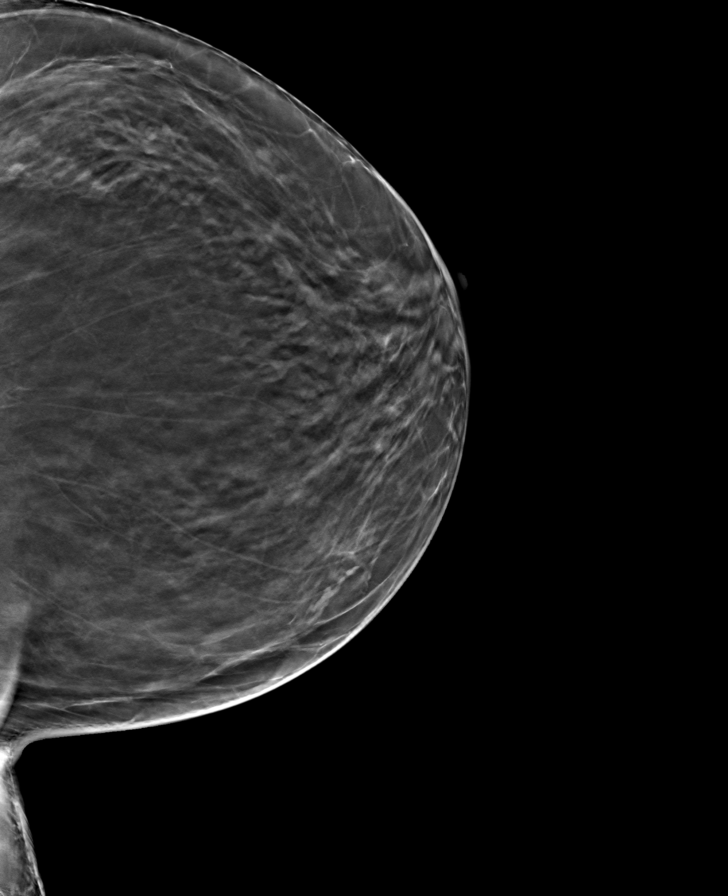

[L MLO tomo · tomo slice 39/76.0]
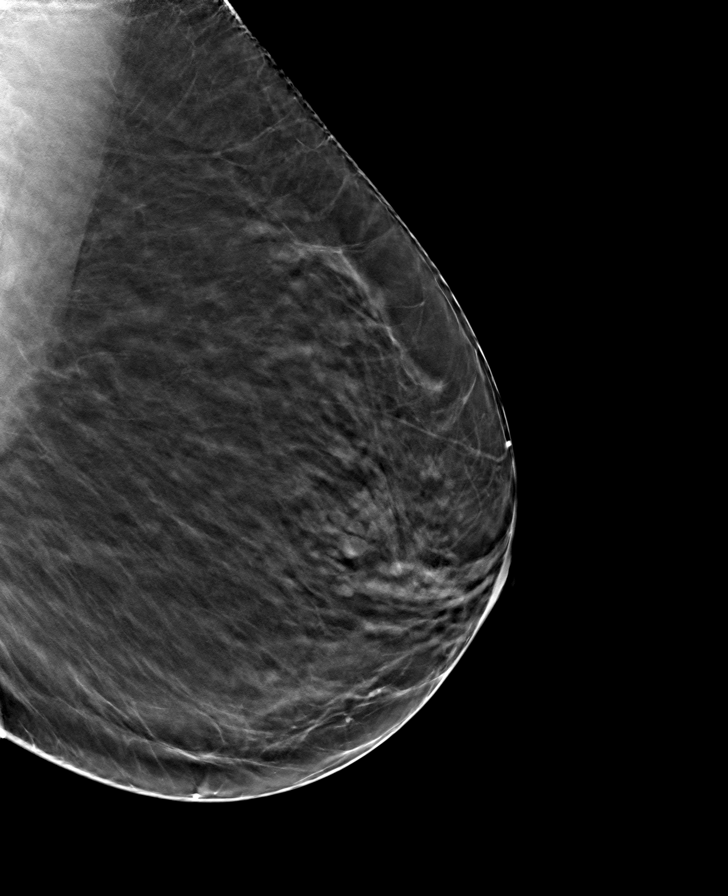

[R MLO tomo · tomo slice 42/83.0]
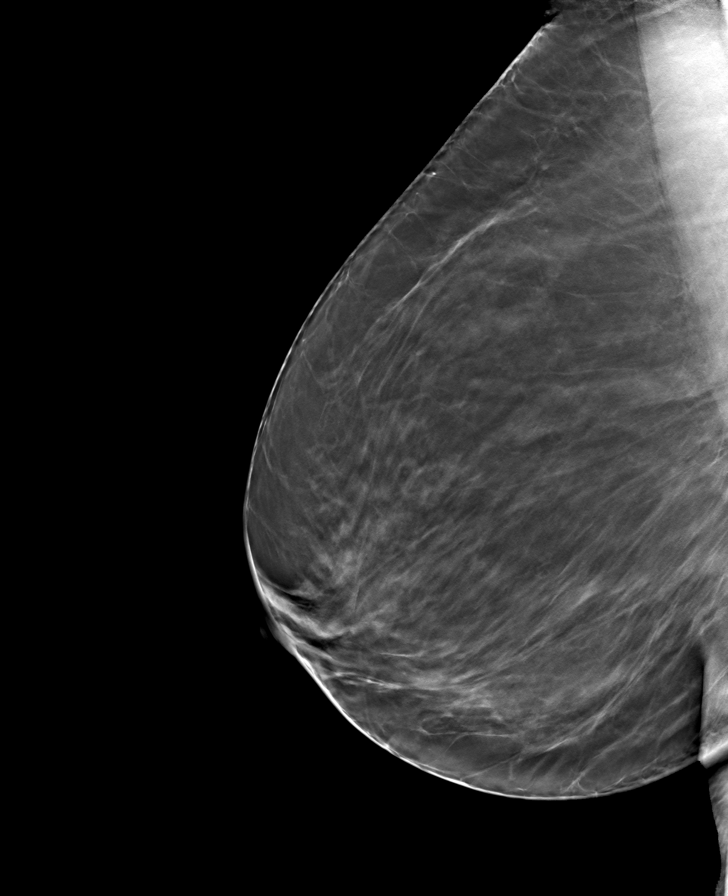

[R CC tomo · tomo slice 35/70.0]
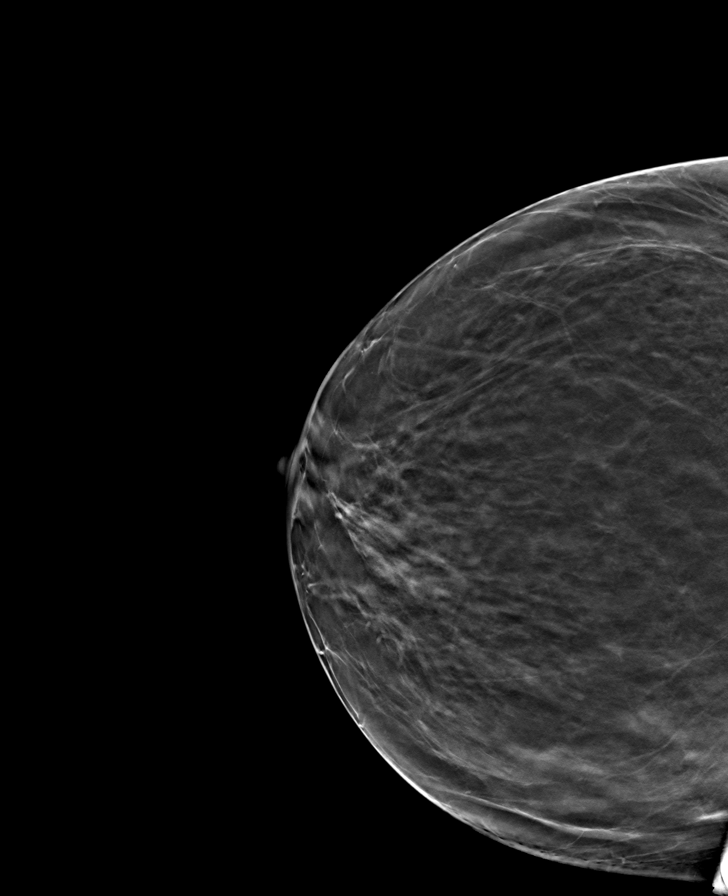

[8 of 24 positions shown; findings below may reference images not displayed]

ACR Breast Density Category b: There are scattered areas of
fibroglandular density.
FINDINGS: There are no findings suspicious for malignancy. Images were
processed with CAD.
IMPRESSION: No mammographic evidence of malignancy. A result letter of this
screening mammogram will be mailed directly to the patient.

RECOMMENDATION:
Screening mammogram in one year. (Code:[id number])

BI-RADS CATEGORY  1: Negative.

## 2020-01-08 ENCOUNTER — Other Ambulatory Visit: Payer: Self-pay | Admitting: Family Medicine

## 2020-01-08 NOTE — Telephone Encounter (Signed)
Please schedule and then route back to nurses 

## 2020-01-08 NOTE — Telephone Encounter (Signed)
Scheduled 4/27

## 2020-01-08 NOTE — Telephone Encounter (Signed)
May have 90-day needs to do virtual follow-up or in person

## 2020-01-14 ENCOUNTER — Telehealth: Payer: Self-pay | Admitting: Family Medicine

## 2020-01-14 ENCOUNTER — Other Ambulatory Visit: Payer: Self-pay | Admitting: *Deleted

## 2020-01-14 MED ORDER — AMLODIPINE BESYLATE 10 MG PO TABS: ORAL_TABLET | ORAL | 0 refills | Status: DC

## 2020-01-14 NOTE — Telephone Encounter (Signed)
Patient said that she only has enough amlodipine to last till Saturday and that [facility] [place] [facility] saying that she can't get a refill until 01-20-20 unless we call and say it is ok to get early????

## 2020-01-14 NOTE — Telephone Encounter (Signed)
[facility] to see what the issue was. Held for 20 mins. Tried to call pt to see what they told her and had to leave a message for her to call back. Ok per dr Lorin Picket to send in a refill with a note stating she can fill today. [facility] with [facility] note to fill today. Will let pt know when she calls back.

## 2020-01-14 NOTE — Telephone Encounter (Signed)
Pt notified and she already has virtual in April

## 2020-01-14 NOTE — Telephone Encounter (Signed)
Can have 90 day but needs follow up virtual or in person per dr scott.

## 2020-01-15 ENCOUNTER — Ambulatory Visit: Payer: Medicare Other

## 2020-01-21 ENCOUNTER — Ambulatory Visit: Payer: Medicare Other | Attending: Internal Medicine

## 2020-01-21 DIAGNOSIS — Z23 Encounter for immunization: Secondary | ICD-10-CM | POA: Insufficient documentation

## 2020-01-21 NOTE — Progress Notes (Signed)
   Covid-19 Vaccination Clinic  Name:  Alicia Wu    MRN: [id number] DOB: 09-11-1951  01/21/2020  [name] was observed post Covid-19 immunization for 15 minutes without incident. She was provided with Vaccine Information Sheet and instruction to access the V-Safe system.   [name] was instructed to call 911 with any severe reactions post vaccine: Marland Kitchen Difficulty breathing  . Swelling of face and throat  . A fast heartbeat  . A bad rash all over body  . Dizziness and weakness   Immunizations Administered    Name Date Dose VIS Date Route   Moderna COVID-19 Vaccine 01/21/2020  8:18 AM 0.5 mL 10/20/2019 Intramuscular   Manufacturer: Moderna   Lot: [id number]   NDC: 83291-916-60

## 2020-02-23 ENCOUNTER — Ambulatory Visit: Payer: Medicare Other | Attending: Internal Medicine

## 2020-02-23 DIAGNOSIS — Z23 Encounter for immunization: Secondary | ICD-10-CM

## 2020-02-23 NOTE — Progress Notes (Signed)
   Covid-19 Vaccination Clinic  Name:  Alicia Wu    MRN: [id number] DOB: 1951/05/01  02/23/2020  Ms. [name] was observed post Covid-19 immunization for 15 minutes without incident. She was provided with Vaccine Information Sheet and instruction to access the V-Safe system.   Ms. [name] was instructed to call 911 with any severe reactions post vaccine: Marland Kitchen Difficulty breathing  . Swelling of face and throat  . A fast heartbeat  . A bad rash all over body  . Dizziness and weakness   Immunizations Administered    Name Date Dose VIS Date Route   Moderna COVID-19 Vaccine 02/23/2020  8:06 AM 0.5 mL 10/20/2019 Intramuscular   Manufacturer: Moderna   Lot: [id number]   NDC: 03474-259-56

## 2020-03-14 ENCOUNTER — Other Ambulatory Visit: Payer: Self-pay | Admitting: Family Medicine

## 2020-03-15 ENCOUNTER — Telehealth: Payer: Self-pay | Admitting: *Deleted

## 2020-03-15 ENCOUNTER — Telehealth (INDEPENDENT_AMBULATORY_CARE_PROVIDER_SITE_OTHER): Payer: Medicare Other | Admitting: Family Medicine

## 2020-03-15 ENCOUNTER — Other Ambulatory Visit: Payer: Self-pay

## 2020-03-15 DIAGNOSIS — Z79899 Other long term (current) drug therapy: Secondary | ICD-10-CM

## 2020-03-15 DIAGNOSIS — I10 Essential (primary) hypertension: Secondary | ICD-10-CM

## 2020-03-15 DIAGNOSIS — E7849 Other hyperlipidemia: Secondary | ICD-10-CM | POA: Diagnosis not present

## 2020-03-15 MED ORDER — AMLODIPINE BESYLATE 10 MG PO TABS: ORAL_TABLET | ORAL | 1 refills | Status: DC

## 2020-03-15 MED ORDER — PRAVASTATIN SODIUM 40 MG PO TABS: ORAL_TABLET | ORAL | 1 refills | Status: DC

## 2020-03-15 NOTE — Progress Notes (Signed)
   Subjective:    Patient ID: Alicia Wu, female    DOB: 1951/10/08, 69 y.o.   MRN: [id number]  Hypertension This is a chronic problem. The current episode started more than 1 year ago. The problem is unchanged. The problem is controlled. Pertinent negatives include no chest pain, headaches or shortness of breath. Improvement on treatment: has been exercising 6 x per week, eating better. There are no compliance problems.    Patient states her blood pressure usually runs around 127/73, however today it was 147/83 but she started taking her amlodipine around 1:30 in the afternoon.  Fall Risk  10/07/2018 09/10/2017 06/07/2017 03/07/2016 08/03/2015  Falls in the past year? 0 Yes No No No  Comment Emmi Telephone Survey: data to providers prior to load - Emmi Telephone Survey: data to providers prior to load - -  Number falls in past yr: - 1 - - -  Injury with Fall? - No - - -  Follow up - Education provided - - -     Review of Systems  Constitutional: Negative for activity change, fatigue and fever.  HENT: Negative for congestion and rhinorrhea.   Respiratory: Negative for cough, chest tightness and shortness of breath.   Cardiovascular: Negative for chest pain and leg swelling.  Gastrointestinal: Negative for abdominal pain and nausea.  Skin: Negative for color change.  Neurological: Negative for dizziness and headaches.  Psychiatric/Behavioral: Negative for agitation and behavioral problems.       Objective:   Physical Exam   Virtual Visit via Video Note  I connected with JANAISHA TOLSMA on 03/15/20 at  2:00 PM EDT by a video enabled telemedicine application and verified that I am speaking with the correct person using two identifiers.  Location: Patient: home Provider: office   I discussed the limitations of evaluation and management by telemedicine and the availability of in person appointments. The patient expressed understanding and agreed to proceed.  History of Present  Illness:    Observations/Objective:   Assessment and Plan:   Follow Up Instructions:    I discussed the assessment and treatment plan with the patient. The patient was provided an opportunity to ask questions and all were answered. The patient agreed with the plan and demonstrated an understanding of the instructions.   The patient was advised to call back or seek an in-person evaluation if the symptoms worsen or if the condition fails to improve as anticipated.  I provided 20 minutes of non-face-to-face time during this encounter.      Assessment & Plan:  1. Essential hypertension, benign Blood pressure good control with medication continue current medications watch diet closely - Lipid Profile - Hepatic function panel - Basic Metabolic Panel (BMET)  2. Other hyperlipidemia Watch diet closely continue medications check labs previous labs reviewed - Lipid Profile - Hepatic function panel - Basic Metabolic Panel (BMET)  3. High risk medication use Check labs - Lipid Profile - Hepatic function panel - Basic Metabolic Panel (BMET)

## 2020-03-15 NOTE — Telephone Encounter (Signed)
Ms. marvia, troost are scheduled for a virtual visit with your provider today.    Just as we do with appointments in the office, we must obtain your consent to participate.  Your consent will be active for this visit and any virtual visit you may have with one of our providers in the next 365 days.    If you have a MyChart account, I can also send a copy of this consent to you electronically.  All virtual visits are billed to your insurance company just like a traditional visit in the office.  As this is a virtual visit, video technology does not allow for your provider to perform a traditional examination.  This may limit your provider's ability to fully assess your condition.  If your provider identifies any concerns that need to be evaluated in person or the need to arrange testing such as labs, EKG, etc, we will make arrangements to do so.    Although advances in technology are sophisticated, we cannot ensure that it will always work on either your end or our end.  If the connection with a video visit is poor, we may have to switch to a telephone visit.  With either a video or telephone visit, we are not always able to ensure that we have a secure connection.   I need to obtain your verbal consent now.   Are you willing to proceed with your visit today?   CALYSTA CRAIGO has provided verbal consent on 03/15/2020 for a virtual visit (video or telephone).   Haze Rushing, LPN 02/15/5187  41:66 AM

## 2020-05-17 ENCOUNTER — Encounter: Payer: Self-pay | Admitting: Family Medicine

## 2020-05-17 ENCOUNTER — Ambulatory Visit (INDEPENDENT_AMBULATORY_CARE_PROVIDER_SITE_OTHER): Payer: Medicare Other | Admitting: Family Medicine

## 2020-05-17 ENCOUNTER — Other Ambulatory Visit: Payer: Self-pay

## 2020-05-17 VITALS — BP 118/80 | Temp 97.7°F | Ht 66.0 in | Wt 190.8 lb

## 2020-05-17 DIAGNOSIS — I10 Essential (primary) hypertension: Secondary | ICD-10-CM

## 2020-05-17 DIAGNOSIS — E7849 Other hyperlipidemia: Secondary | ICD-10-CM

## 2020-05-17 MED ORDER — AMLODIPINE BESYLATE 10 MG PO TABS: ORAL_TABLET | ORAL | 1 refills | Status: DC

## 2020-05-17 MED ORDER — PRAVASTATIN SODIUM 40 MG PO TABS: ORAL_TABLET | ORAL | 1 refills | Status: DC

## 2020-05-17 NOTE — Progress Notes (Signed)
   Subjective:    Patient ID: Alicia Wu, female    DOB: Feb 24, 1951, 69 y.o.   MRN: [id number]  Hypertension This is a chronic problem. Pertinent negatives include no chest pain, headaches or neck pain. Treatments tried: amlodipine. There are no compliance problems (takes meds everyday, walks 6 days a week, eats healthy).    Pt states no concerns today.  Fall Risk  05/17/2020 03/15/2020 10/07/2018 09/10/2017 06/07/2017  Falls in the past year? 0 0 0 Yes No  Comment - - Emmi Telephone Survey: data to providers prior to load - Emmi Telephone Survey: data to providers prior to load  Number falls in past yr: - - - 1 -  Injury with Fall? - - - No -  Follow up Falls evaluation completed Falls evaluation completed - Education provided -     Review of Systems  Constitutional: Negative for activity change, appetite change and fatigue.  HENT: Negative for congestion and rhinorrhea.   Eyes: Negative for discharge.  Respiratory: Negative for cough, chest tightness and wheezing.   Cardiovascular: Negative for chest pain.  Gastrointestinal: Negative for abdominal pain, blood in stool and vomiting.  Endocrine: Negative for polyphagia.  Genitourinary: Negative for difficulty urinating and frequency.  Musculoskeletal: Negative for neck pain.  Skin: Negative for color change.  Allergic/Immunologic: Negative for environmental allergies and food allergies.  Neurological: Negative for weakness and headaches.  Psychiatric/Behavioral: Negative for agitation and behavioral problems.       Objective:   Physical Exam Vitals reviewed.  Constitutional:      General: She is not in acute distress. HENT:     Head: Normocephalic and atraumatic.  Eyes:     General:        Right eye: No discharge.        Left eye: No discharge.  Neck:     Trachea: No tracheal deviation.  Cardiovascular:     Rate and Rhythm: Normal rate and regular rhythm.     Heart sounds: Normal heart sounds. No murmur heard.    Pulmonary:     Effort: Pulmonary effort is normal. No respiratory distress.     Breath sounds: Normal breath sounds.  Lymphadenopathy:     Cervical: No cervical adenopathy.  Skin:    General: Skin is warm and dry.  Neurological:     Mental Status: She is alert.     Coordination: Coordination normal.  Psychiatric:        Behavior: Behavior normal.           Assessment & Plan:  1. Essential hypertension, benign Blood pressure was rechecked under good control continue current measures watch diet  2. Other hyperlipidemia Continue statin.  Lab work recommended  We will also review over risk factors regarding aspirin use quite possible we may stop the aspirin after reading her labs

## 2020-05-18 LAB — LIPID PANEL
Chol/HDL Ratio: 2.2 ratio (ref 0.0–4.4)
Cholesterol, Total: 212 mg/dL — ABNORMAL HIGH (ref 100–199)
HDL: 96 mg/dL (ref >39)
LDL Chol Calc (NIH): 104 mg/dL — ABNORMAL HIGH (ref 0–99)
Triglycerides: 65 mg/dL (ref 0–149)
VLDL Cholesterol Cal: 12 mg/dL (ref 5–40)

## 2020-05-18 LAB — HEPATIC FUNCTION PANEL
ALT: 27 IU/L (ref 0–32)
AST: 35 IU/L (ref 0–40)
Albumin: 4.6 g/dL (ref 3.8–4.8)
Alkaline Phosphatase: 93 IU/L (ref 48–121)
Bilirubin Total: 0.4 mg/dL (ref 0.0–1.2)
Bilirubin, Direct: 0.11 mg/dL (ref 0.00–0.40)
Total Protein: 7.5 g/dL (ref 6.0–8.5)

## 2020-05-18 LAB — BASIC METABOLIC PANEL
BUN/Creatinine Ratio: 9 — ABNORMAL LOW (ref 12–28)
BUN: 10 mg/dL (ref 8–27)
CO2: 24 mmol/L (ref 20–29)
Calcium: 10.2 mg/dL (ref 8.7–10.3)
Chloride: 103 mmol/L (ref 96–106)
Creatinine, Ser: 1.08 mg/dL — ABNORMAL HIGH (ref 0.57–1.00)
GFR calc Af Amer: 60 mL/min/{1.73_m2} (ref >59)
GFR calc non Af Amer: 52 mL/min/{1.73_m2} — ABNORMAL LOW (ref >59)
Glucose: 95 mg/dL (ref 65–99)
Potassium: 4 mmol/L (ref 3.5–5.2)
Sodium: 143 mmol/L (ref 134–144)

## 2020-05-26 ENCOUNTER — Other Ambulatory Visit: Payer: Self-pay | Admitting: Family Medicine

## 2020-05-26 ENCOUNTER — Encounter: Payer: Self-pay | Admitting: Family Medicine

## 2020-06-13 ENCOUNTER — Other Ambulatory Visit (HOSPITAL_COMMUNITY): Payer: Self-pay | Admitting: Family Medicine

## 2020-06-13 DIAGNOSIS — Z1231 Encounter for screening mammogram for malignant neoplasm of breast: Secondary | ICD-10-CM

## 2020-07-20 ENCOUNTER — Ambulatory Visit (HOSPITAL_COMMUNITY)
Admission: RE | Admit: 2020-07-20 | Discharge: 2020-07-20 | Disposition: A | Discharge status: Home / Self Care | Payer: Medicare Other | Source: Ambulatory Visit | Attending: Family Medicine | Admitting: Family Medicine

## 2020-07-20 ENCOUNTER — Other Ambulatory Visit: Payer: Self-pay

## 2020-07-20 DIAGNOSIS — Z1231 Encounter for screening mammogram for malignant neoplasm of breast: Secondary | ICD-10-CM | POA: Diagnosis not present

## 2020-09-01 DIAGNOSIS — H401131 Primary open-angle glaucoma, bilateral, mild stage: Secondary | ICD-10-CM | POA: Diagnosis not present

## 2020-09-24 DIAGNOSIS — Z23 Encounter for immunization: Secondary | ICD-10-CM | POA: Diagnosis not present

## 2020-11-16 ENCOUNTER — Ambulatory Visit (INDEPENDENT_AMBULATORY_CARE_PROVIDER_SITE_OTHER): Payer: Medicare Other | Admitting: Family Medicine

## 2020-11-16 ENCOUNTER — Other Ambulatory Visit: Payer: Self-pay

## 2020-11-16 ENCOUNTER — Encounter: Payer: Self-pay | Admitting: Family Medicine

## 2020-11-16 VITALS — BP 130/72 | Temp 97.9°F | Ht 66.0 in | Wt 188.8 lb

## 2020-11-16 DIAGNOSIS — I10 Essential (primary) hypertension: Secondary | ICD-10-CM

## 2020-11-16 DIAGNOSIS — E7849 Other hyperlipidemia: Secondary | ICD-10-CM

## 2020-11-16 MED ORDER — AMLODIPINE BESYLATE 10 MG PO TABS: ORAL_TABLET | ORAL | 1 refills | Status: DC

## 2020-11-16 MED ORDER — PRAVASTATIN SODIUM 40 MG PO TABS
ORAL_TABLET | ORAL | 1 refills | Status: DC
Start: 2020-11-16 — End: 2021-03-22

## 2020-11-16 NOTE — Progress Notes (Signed)
° °  Subjective:    Patient ID: Alicia Wu, female    DOB: [birth year], 69 y.o.   MRN: [id number]  Hypertension This is a chronic problem. The current episode started more than 1 year ago. Pertinent negatives include no chest pain or shortness of breath. Risk factors for coronary artery disease include post-menopausal state and dyslipidemia. Treatments tried: norvasc. There are no compliance problems.    Knee pain with sitting still  Patient for blood pressure check up.  The patient does have hypertension.  The patient is on medication.  Patient relates compliance with meds. Todays BP reviewed with the patient. Patient denies issues with medication. Patient relates reasonable diet. Patient tries to minimize salt. Patient aware of BP goals.  Patient here for follow-up regarding cholesterol.  The patient does have hyperlipidemia.  Patient does try to maintain a reasonable diet.  Patient does take the medication on a regular basis.  Denies missing a dose.  The patient denies any obvious side effects.  Prior blood work results reviewed with the patient.  The patient is aware of his cholesterol goals and the need to keep it under good control to lessen the risk of disease.   Review of Systems  Constitutional: Negative for activity change, appetite change and fatigue.  HENT: Negative for congestion and rhinorrhea.   Respiratory: Negative for cough and shortness of breath.   Cardiovascular: Negative for chest pain and leg swelling.  Gastrointestinal: Negative for abdominal pain and diarrhea.  Endocrine: Negative for polydipsia and polyphagia.  Skin: Negative for color change.  Neurological: Negative for dizziness and weakness.  Psychiatric/Behavioral: Negative for behavioral problems and confusion.       Objective:   Physical Exam Vitals reviewed.  Constitutional:      General: She is not in acute distress.    Appearance: She is well-nourished.  HENT:     Head: Normocephalic and atraumatic.   Eyes:     General:        Right eye: No discharge.        Left eye: No discharge.  Neck:     Trachea: No tracheal deviation.  Cardiovascular:     Rate and Rhythm: Normal rate and regular rhythm.     Heart sounds: Normal heart sounds. No murmur heard.   Pulmonary:     Effort: Pulmonary effort is normal. No respiratory distress.     Breath sounds: Normal breath sounds.  Musculoskeletal:        General: No edema.  Lymphadenopathy:     Cervical: No cervical adenopathy.  Skin:    General: Skin is warm and dry.  Neurological:     Mental Status: She is alert.     Coordination: Coordination normal.  Psychiatric:        Mood and Affect: Mood and affect normal.        Behavior: Behavior normal.           Assessment & Plan:  HTN- patient seen for follow-up regarding HTN.  Diet, medication compliance, appropriate labs and refills were completed.  Importance of keeping blood pressure under good control to lessen the risk of complications discussed  Hyperlipidemia-importance of diet, weight control, activity, compliance with medications discussed.  Recent labs reviewed.  Any additional labs or refills ordered.  Importance of keeping under good control discussed.  She is doing a good job with taking her medications.  Continue to stay active.  Blood pressure looks good today.  Follow-up 6 months

## 2020-11-16 NOTE — Patient Instructions (Signed)
DASH Eating Plan DASH stands for "Dietary Approaches to Stop Hypertension." The DASH eating plan is a healthy eating plan that has been shown to reduce high blood pressure (hypertension). It may also reduce your risk for type 2 diabetes, heart disease, and stroke. The DASH eating plan may also help with weight loss. What are tips for following this plan?  General guidelines  Avoid eating more than 2,300 mg (milligrams) of salt (sodium) a day. If you have hypertension, you may need to reduce your sodium intake to 1,500 mg a day.  Limit alcohol intake to no more than 1 drink a day for nonpregnant women and 2 drinks a day for men. One drink equals 12 oz of beer, 5 oz of wine, or 1 oz of hard liquor.  Work with your health care provider to maintain a healthy body weight or to lose weight. Ask what an ideal weight is for you.  Get at least 30 minutes of exercise that causes your heart to beat faster (aerobic exercise) most days of the week. Activities may include walking, swimming, or biking.  Work with your health care provider or diet and nutrition specialist (dietitian) to adjust your eating plan to your individual calorie needs. Reading food labels   Check food labels for the amount of sodium per serving. Choose foods with less than 5 percent of the Daily Value of sodium. Generally, foods with less than 300 mg of sodium per serving fit into this eating plan.  To find whole grains, look for the word "whole" as the first word in the ingredient list. Shopping  Buy products labeled as "low-sodium" or "no salt added."  Buy fresh foods. Avoid canned foods and premade or frozen meals. Cooking  Avoid adding salt when cooking. Use salt-free seasonings or herbs instead of table salt or sea salt. Check with your health care provider or pharmacist before using salt substitutes.  Do not fry foods. Cook foods using healthy methods such as baking, boiling, grilling, and broiling instead.  Cook with  heart-healthy oils, such as olive, canola, soybean, or sunflower oil. Meal planning  Eat a balanced diet that includes: ? 5 or more servings of fruits and vegetables each day. At each meal, try to fill half of your plate with fruits and vegetables. ? Up to 6-8 servings of whole grains each day. ? Less than 6 oz of lean meat, poultry, or fish each day. A 3-oz serving of meat is about the same size as a deck of cards. One egg equals 1 oz. ? 2 servings of low-fat dairy each day. ? A serving of nuts, seeds, or beans 5 times each week. ? Heart-healthy fats. Healthy fats called Omega-3 fatty acids are found in foods such as flaxseeds and coldwater fish, like sardines, salmon, and mackerel.  Limit how much you eat of the following: ? Canned or prepackaged foods. ? Food that is high in trans fat, such as fried foods. ? Food that is high in saturated fat, such as fatty meat. ? Sweets, desserts, sugary drinks, and other foods with added sugar. ? Full-fat dairy products.  Do not salt foods before eating.  Try to eat at least 2 vegetarian meals each week.  Eat more home-cooked food and less restaurant, buffet, and fast food.  When eating at a restaurant, ask that your food be prepared with less salt or no salt, if possible. What foods are recommended? The items listed may not be a complete list. Talk with your dietitian about   what dietary choices are best for you. Grains Whole-grain or whole-wheat bread. Whole-grain or whole-wheat pasta. Brown rice. Oatmeal. Quinoa. Bulgur. Whole-grain and low-sodium cereals. Pita bread. Low-fat, low-sodium crackers. Whole-wheat flour tortillas. Vegetables Fresh or frozen vegetables (raw, steamed, roasted, or grilled). Low-sodium or reduced-sodium tomato and vegetable juice. Low-sodium or reduced-sodium tomato sauce and tomato paste. Low-sodium or reduced-sodium canned vegetables. Fruits All fresh, dried, or frozen fruit. Canned fruit in natural juice (without  added sugar). Meat and other protein foods Skinless chicken or turkey. Ground chicken or turkey. Pork with fat trimmed off. Fish and seafood. Egg whites. Dried beans, peas, or lentils. Unsalted nuts, nut butters, and seeds. Unsalted canned beans. Lean cuts of beef with fat trimmed off. Low-sodium, lean deli meat. Dairy Low-fat (1%) or fat-free (skim) milk. Fat-free, low-fat, or reduced-fat cheeses. Nonfat, low-sodium ricotta or cottage cheese. Low-fat or nonfat yogurt. Low-fat, low-sodium cheese. Fats and oils Soft margarine without trans fats. Vegetable oil. Low-fat, reduced-fat, or light mayonnaise and salad dressings (reduced-sodium). Canola, safflower, olive, soybean, and sunflower oils. Avocado. Seasoning and other foods Herbs. Spices. Seasoning mixes without salt. Unsalted popcorn and pretzels. Fat-free sweets. What foods are not recommended? The items listed may not be a complete list. Talk with your dietitian about what dietary choices are best for you. Grains Baked goods made with fat, such as croissants, muffins, or some breads. Dry pasta or rice meal packs. Vegetables Creamed or fried vegetables. Vegetables in a cheese sauce. Regular canned vegetables (not low-sodium or reduced-sodium). Regular canned tomato sauce and paste (not low-sodium or reduced-sodium). Regular tomato and vegetable juice (not low-sodium or reduced-sodium). Pickles. Olives. Fruits Canned fruit in a light or heavy syrup. Fried fruit. Fruit in cream or butter sauce. Meat and other protein foods Fatty cuts of meat. Ribs. Fried meat. Bacon. Sausage. Bologna and other processed lunch meats. Salami. Fatback. Hotdogs. Bratwurst. Salted nuts and seeds. Canned beans with added salt. Canned or smoked fish. Whole eggs or egg yolks. Chicken or turkey with skin. Dairy Whole or 2% milk, cream, and half-and-half. Whole or full-fat cream cheese. Whole-fat or sweetened yogurt. Full-fat cheese. Nondairy creamers. Whipped toppings.  Processed cheese and cheese spreads. Fats and oils Butter. Stick margarine. Lard. Shortening. Ghee. Bacon fat. Tropical oils, such as coconut, palm kernel, or palm oil. Seasoning and other foods Salted popcorn and pretzels. Onion salt, garlic salt, seasoned salt, table salt, and sea salt. Worcestershire sauce. Tartar sauce. Barbecue sauce. Teriyaki sauce. Soy sauce, including reduced-sodium. Steak sauce. Canned and packaged gravies. Fish sauce. Oyster sauce. Cocktail sauce. Horseradish that you find on the shelf. Ketchup. Mustard. Meat flavorings and tenderizers. Bouillon cubes. Hot sauce and Tabasco sauce. Premade or packaged marinades. Premade or packaged taco seasonings. Relishes. Regular salad dressings. Where to find more information:  National Heart, Lung, and Blood Institute: www.nhlbi.nih.gov  American Heart Association: www.heart.org Summary  The DASH eating plan is a healthy eating plan that has been shown to reduce high blood pressure (hypertension). It may also reduce your risk for type 2 diabetes, heart disease, and stroke.  With the DASH eating plan, you should limit salt (sodium) intake to 2,300 mg a day. If you have hypertension, you may need to reduce your sodium intake to 1,500 mg a day.  When on the DASH eating plan, aim to eat more fresh fruits and vegetables, whole grains, lean proteins, low-fat dairy, and heart-healthy fats.  Work with your health care provider or diet and nutrition specialist (dietitian) to adjust your eating plan to your   individual calorie needs. This information is not intended to replace advice given to you by your health care provider. Make sure you discuss any questions you have with your health care provider. Document Revised: 10/18/2017 Document Reviewed: 10/29/2016 Elsevier Patient Education  2020 [employer]  

## 2020-12-16 ENCOUNTER — Other Ambulatory Visit: Payer: Self-pay

## 2020-12-16 ENCOUNTER — Ambulatory Visit (INDEPENDENT_AMBULATORY_CARE_PROVIDER_SITE_OTHER): Payer: Medicare Other | Admitting: Nurse Practitioner

## 2020-12-16 ENCOUNTER — Encounter: Payer: Self-pay | Admitting: Nurse Practitioner

## 2020-12-16 VITALS — BP 140/88 | HR 89 | Temp 97.6°F | Wt 191.2 lb

## 2020-12-16 DIAGNOSIS — Z0000 Encounter for general adult medical examination without abnormal findings: Secondary | ICD-10-CM | POA: Diagnosis not present

## 2020-12-16 DIAGNOSIS — I10 Essential (primary) hypertension: Secondary | ICD-10-CM | POA: Diagnosis not present

## 2020-12-16 DIAGNOSIS — Z01419 Encounter for gynecological examination (general) (routine) without abnormal findings: Secondary | ICD-10-CM

## 2020-12-16 NOTE — Progress Notes (Signed)
   Subjective:    Patient ID: SUHEY RADFORD, female    DOB: 22-Jun-1951, 70 y.o.   MRN: [id number]  HPI AWV- Annual Wellness Visit  The patient was seen for their annual wellness visit. The patient's past medical history, surgical history, and family history were reviewed. Pertinent vaccines were reviewed ( tetanus, pneumonia, shingles, flu) The patient's medication list was reviewed and updated.  The height and weight were entered.  BMI recorded in electronic record elsewhere  Cognitive screening was completed. Outcome of Mini - Cog: PASS   Falls /depression screening electronically recorded within record elsewhere  Current tobacco usage: none (All patients who use tobacco were given written and verbal information on quitting)  Recent listing of emergency department/hospitalizations over the past year were reviewed.  current specialist the patient sees on a regular basis: none   Medicare annual wellness visit patient questionnaire was reviewed.  A written screening schedule for the patient for the next 5-10 years was given. Appropriate discussion of followup regarding next visit was discussed.      Review of Systems     Objective:   Physical Exam        Assessment & Plan:

## 2020-12-16 NOTE — Progress Notes (Signed)
Subjective:    Patient ID: Alicia Wu, female    DOB: 1951-07-15, 70 y.o.   MRN: [id number] AWV- Annual Wellness Visit  The patient was seen for their annual wellness visit. The patient's past medical history, surgical history, and family history were reviewed. Pertinent vaccines were reviewed ( tetanus, pneumonia, shingles, flu) The patient's medication list was reviewed and updated.  The height and weight were entered.  BMI recorded in electronic record elsewhere  Cognitive screening was completed. Outcome of Mini - Cog: PASS   Falls /depression screening electronically recorded within record elsewhere  Current tobacco usage: none (All patients who use tobacco were given written and verbal information on quitting)  Recent listing of emergency department/hospitalizations over the past year were reviewed.  current specialist the patient sees on a regular basis: none   Medicare annual wellness visit patient questionnaire was reviewed.  A written screening schedule for the patient for the next 5-10 years was given. Appropriate discussion of followup regarding next visit was discussed.  HPI  Patient came for yearly wellness physical. Patient has no current concerns. Would like medications and labs review.  States she eats healthy and exercise daily. Up to date with COVID vaccine, flu shot , eye exam,and  dental check ups.   Review of Systems  Constitutional: Negative for activity change, appetite change, fatigue, fever and unexpected weight change.  Respiratory: Negative for cough, chest tightness, shortness of breath and wheezing.   Cardiovascular: Negative for chest pain and leg swelling.  Gastrointestinal: Negative for abdominal distention, abdominal pain, blood in stool, constipation, diarrhea, nausea and vomiting.  Genitourinary: Negative for difficulty urinating, dysuria, enuresis, frequency, genital sores, pelvic pain, urgency and vaginal discharge.   Depression  screen [facility] 2/9 12/16/2020 11/16/2020 09/10/2017 03/07/2016 08/03/2015  Decreased Interest 0 0 0 0 0  Down, Depressed, Hopeless 0 0 0 0 0  PHQ - 2 Score 0 0 0 0 0        Objective:   Physical Exam Constitutional:      Appearance: Normal appearance.  Neck:     Comments: Thyroid non tender to palpation. No mass or goiter noted.  Cardiovascular:     Rate and Rhythm: Normal rate and regular rhythm.     Pulses: Normal pulses.     Heart sounds: Normal heart sounds. No murmur heard. No gallop.      Comments: Normal heart tones heard S1, S2, bilateral radial pulses 2+.  Pulmonary:     Effort: Pulmonary effort is normal. No respiratory distress.     Breath sounds: Normal breath sounds.     Comments: Bilateral lung sounds are clear and equal in all bases. Chest:  Breasts:     Right: Normal. No swelling or mass.     Left: Normal. No swelling or mass.    Abdominal:     General: There is no distension.     Palpations: Abdomen is soft. There is no mass.     Tenderness: There is no abdominal tenderness.     Hernia: No hernia is present.  Genitourinary:    General: Normal vulva.     Vagina: No vaginal discharge.     Comments: External genitalia: no rash or lesions. Pt. had hysterectomy. Vagina: pink, no discharge or lesions. Bimanual exam: no tenderness or obvious masses.   Musculoskeletal:     Cervical back: Normal range of motion and neck supple.  Skin:    General: Skin is warm and dry.     Comments:  No rashes, no lesions, no sores.  Neurological:     Mental Status: She is alert and oriented to person, place, and time.  Psychiatric:        Mood and Affect: Mood normal.        Behavior: Behavior normal.        Thought Content: Thought content normal.        Judgment: Judgment normal.         Assessment & Plan:   Problem List Items Addressed This Visit      Cardiovascular and Mediastinum   Essential hypertension, benign (Chronic)    Other Visit Diagnoses    Well woman exam     -  Primary      1) Encouraged continued healthy diet and activity, including weight bearing exercises.  2) Ordered bone density exam for June; wishes to do this in the summer.  Return in about 6 months (around 06/15/2021).

## 2020-12-17 ENCOUNTER — Encounter: Payer: Self-pay | Admitting: Nurse Practitioner

## 2021-03-02 DIAGNOSIS — H401130 Primary open-angle glaucoma, bilateral, stage unspecified: Secondary | ICD-10-CM | POA: Diagnosis not present

## 2021-03-02 DIAGNOSIS — H401131 Primary open-angle glaucoma, bilateral, mild stage: Secondary | ICD-10-CM | POA: Diagnosis not present

## 2021-03-22 ENCOUNTER — Other Ambulatory Visit: Payer: Self-pay | Admitting: *Deleted

## 2021-03-22 MED ORDER — PRAVASTATIN SODIUM 40 MG PO TABS: ORAL_TABLET | ORAL | 0 refills | Status: DC

## 2021-03-22 MED ORDER — AMLODIPINE BESYLATE 10 MG PO TABS: ORAL_TABLET | ORAL | 0 refills | Status: DC

## 2021-03-23 DIAGNOSIS — Z23 Encounter for immunization: Secondary | ICD-10-CM | POA: Diagnosis not present

## 2021-04-11 ENCOUNTER — Telehealth: Payer: Self-pay | Admitting: *Deleted

## 2021-04-11 ENCOUNTER — Other Ambulatory Visit: Payer: Self-pay | Admitting: *Deleted

## 2021-04-11 DIAGNOSIS — M8580 Other specified disorders of bone density and structure, unspecified site: Secondary | ICD-10-CM

## 2021-04-11 DIAGNOSIS — Z780 Asymptomatic menopausal state: Secondary | ICD-10-CM

## 2021-04-11 NOTE — Telephone Encounter (Signed)
Scheduled June 7th at 8:30 arrive 8:15am. Pt notified of appt

## 2021-04-11 NOTE — Telephone Encounter (Signed)
Per [facility] file:   Patient needs bone density beginning of June.  Left message to return call to discuss with patient.

## 2021-04-25 ENCOUNTER — Ambulatory Visit (HOSPITAL_COMMUNITY): Payer: Medicare Other

## 2021-05-02 ENCOUNTER — Encounter: Payer: Self-pay | Admitting: Family Medicine

## 2021-05-02 ENCOUNTER — Ambulatory Visit (HOSPITAL_COMMUNITY)
Admission: RE | Admit: 2021-05-02 | Discharge: 2021-05-02 | Disposition: A | Discharge status: Home / Self Care | Payer: Medicare Other | Source: Ambulatory Visit | Attending: Family Medicine | Admitting: Family Medicine

## 2021-05-02 DIAGNOSIS — M8580 Other specified disorders of bone density and structure, unspecified site: Secondary | ICD-10-CM | POA: Insufficient documentation

## 2021-05-02 DIAGNOSIS — Z780 Asymptomatic menopausal state: Secondary | ICD-10-CM | POA: Insufficient documentation

## 2021-05-16 ENCOUNTER — Ambulatory Visit (INDEPENDENT_AMBULATORY_CARE_PROVIDER_SITE_OTHER): Payer: Medicare Other | Admitting: Family Medicine

## 2021-05-16 ENCOUNTER — Encounter: Payer: Self-pay | Admitting: Family Medicine

## 2021-05-16 ENCOUNTER — Other Ambulatory Visit: Payer: Self-pay

## 2021-05-16 VITALS — BP 129/76 | HR 77 | Temp 97.2°F | Ht 66.0 in | Wt 195.2 lb

## 2021-05-16 DIAGNOSIS — E7849 Other hyperlipidemia: Secondary | ICD-10-CM | POA: Diagnosis not present

## 2021-05-16 DIAGNOSIS — I10 Essential (primary) hypertension: Secondary | ICD-10-CM

## 2021-05-16 DIAGNOSIS — Z79899 Other long term (current) drug therapy: Secondary | ICD-10-CM

## 2021-05-16 MED ORDER — PRAVASTATIN SODIUM 40 MG PO TABS: ORAL_TABLET | ORAL | 1 refills | Status: DC

## 2021-05-16 MED ORDER — AMLODIPINE BESYLATE 10 MG PO TABS: ORAL_TABLET | ORAL | 1 refills | Status: DC

## 2021-05-16 NOTE — Patient Instructions (Signed)
https://www.nhlbi.nih.gov/files/docs/public/heart/dash_brief.pdf">  DASH Eating Plan DASH stands for Dietary Approaches to Stop Hypertension. The DASH eating plan is a healthy eating plan that has been shown to: Reduce high blood pressure (hypertension). Reduce your risk for type 2 diabetes, heart disease, and stroke. Help with weight loss. What are tips for following this plan? Reading food labels Check food labels for the amount of salt (sodium) per serving. Choose foods with less than 5 percent of the Daily Value of sodium. Generally, foods with less than 300 milligrams (mg) of sodium per serving fit into this eating plan. To find whole grains, look for the word "whole" as the first word in the ingredient list. Shopping Buy products labeled as "low-sodium" or "no salt added." Buy fresh foods. Avoid canned foods and pre-made or frozen meals. Cooking Avoid adding salt when cooking. Use salt-free seasonings or herbs instead of table salt or sea salt. Check with your health care provider or pharmacist before using salt substitutes. Do not fry foods. Cook foods using healthy methods such as baking, boiling, grilling, roasting, and broiling instead. Cook with heart-healthy oils, such as olive, canola, avocado, soybean, or sunflower oil. Meal planning  Eat a balanced diet that includes: 4 or more servings of fruits and 4 or more servings of vegetables each day. Try to fill one-half of your plate with fruits and vegetables. 6-8 servings of whole grains each day. Less than 6 oz (170 g) of lean meat, poultry, or fish each day. A 3-oz (85-g) serving of meat is about the same size as a deck of cards. One egg equals 1 oz (28 g). 2-3 servings of low-fat dairy each day. One serving is 1 cup (237 mL). 1 serving of nuts, seeds, or beans 5 times each week. 2-3 servings of heart-healthy fats. Healthy fats called omega-3 fatty acids are found in foods such as walnuts, flaxseeds, fortified milks, and eggs.  These fats are also found in cold-water fish, such as sardines, salmon, and mackerel. Limit how much you eat of: Canned or prepackaged foods. Food that is high in trans fat, such as some fried foods. Food that is high in saturated fat, such as fatty meat. Desserts and other sweets, sugary drinks, and other foods with added sugar. Full-fat dairy products. Do not salt foods before eating. Do not eat more than 4 egg yolks a week. Try to eat at least 2 vegetarian meals a week. Eat more home-cooked food and less restaurant, buffet, and fast food.  Lifestyle When eating at a restaurant, ask that your food be prepared with less salt or no salt, if possible. If you drink alcohol: Limit how much you use to: 0-1 drink a day for women who are not pregnant. 0-2 drinks a day for men. Be aware of how much alcohol is in your drink. In the [place] one drink equals one 12 oz bottle of beer (355 mL), one 5 oz glass of wine (148 mL), or one 1 oz glass of hard liquor (44 mL). General information Avoid eating more than 2,300 mg of salt a day. If you have hypertension, you may need to reduce your sodium intake to 1,500 mg a day. Work with your health care provider to maintain a healthy body weight or to lose weight. Ask what an ideal weight is for you. Get at least 30 minutes of exercise that causes your heart to beat faster (aerobic exercise) most days of the week. Activities may include walking, swimming, or biking. Work with your health care provider   or dietitian to adjust your eating plan to your individual calorie needs. What foods should I eat? Fruits All fresh, dried, or frozen fruit. Canned fruit in natural juice (without addedsugar). Vegetables Fresh or frozen vegetables (raw, steamed, roasted, or grilled). Low-sodium or reduced-sodium tomato and vegetable juice. Low-sodium or reduced-sodium tomatosauce and tomato paste. Low-sodium or reduced-sodium canned vegetables. Grains Whole-grain or  whole-wheat bread. Whole-grain or whole-wheat pasta. Brown rice. Oatmeal. Quinoa. Bulgur. Whole-grain and low-sodium cereals. Pita bread.Low-fat, low-sodium crackers. Whole-wheat flour tortillas. Meats and other proteins Skinless chicken or turkey. Ground chicken or turkey. Pork with fat trimmed off. Fish and seafood. Egg whites. Dried beans, peas, or lentils. Unsalted nuts, nut butters, and seeds. Unsalted canned beans. Lean cuts of beef with fat trimmed off. Low-sodium, lean precooked or cured meat, such as sausages or meatloaves. Dairy Low-fat (1%) or fat-free (skim) milk. Reduced-fat, low-fat, or fat-free cheeses. Nonfat, low-sodium ricotta or cottage cheese. Low-fat or nonfatyogurt. Low-fat, low-sodium cheese. Fats and oils Soft margarine without trans fats. Vegetable oil. Reduced-fat, low-fat, or light mayonnaise and salad dressings (reduced-sodium). Canola, safflower, olive, avocado, soybean, andsunflower oils. Avocado. Seasonings and condiments Herbs. Spices. Seasoning mixes without salt. Other foods Unsalted popcorn and pretzels. Fat-free sweets. The items listed above may not be a complete list of foods and beverages you can eat. Contact a dietitian for more information. What foods should I avoid? Fruits Canned fruit in a light or heavy syrup. Fried fruit. Fruit in cream or buttersauce. Vegetables Creamed or fried vegetables. Vegetables in a cheese sauce. Regular canned vegetables (not low-sodium or reduced-sodium). Regular canned tomato sauce and paste (not low-sodium or reduced-sodium). Regular tomato and vegetable juice(not low-sodium or reduced-sodium). Pickles. Olives. Grains Baked goods made with fat, such as croissants, muffins, or some breads. Drypasta or rice meal packs. Meats and other proteins Fatty cuts of meat. Ribs. Fried meat. Bacon. Bologna, salami, and other precooked or cured meats, such as sausages or meat loaves. Fat from the back of a pig (fatback). Bratwurst.  Salted nuts and seeds. Canned beans with added salt. Canned orsmoked fish. Whole eggs or egg yolks. Chicken or turkey with skin. Dairy Whole or 2% milk, cream, and half-and-half. Whole or full-fat cream cheese. Whole-fat or sweetened yogurt. Full-fat cheese. Nondairy creamers. Whippedtoppings. Processed cheese and cheese spreads. Fats and oils Butter. Stick margarine. Lard. Shortening. Ghee. Bacon fat. Tropical oils, suchas coconut, palm kernel, or palm oil. Seasonings and condiments Onion salt, garlic salt, seasoned salt, table salt, and sea salt. Worcestershire sauce. Tartar sauce. Barbecue sauce. Teriyaki sauce. Soy sauce, including reduced-sodium. Steak sauce. Canned and packaged gravies. Fish sauce. Oyster sauce. Cocktail sauce. Store-bought horseradish. Ketchup. Mustard. Meat flavorings and tenderizers. Bouillon cubes. Hot sauces. Pre-made or packaged marinades. Pre-made or packaged taco seasonings. Relishes. Regular saladdressings. Other foods Salted popcorn and pretzels. The items listed above may not be a complete list of foods and beverages you should avoid. Contact a dietitian for more information. Where to find more information National Heart, Lung, and Blood Institute: www.nhlbi.nih.gov American Heart Association: www.heart.org Academy of Nutrition and Dietetics: www.eatright.org National Kidney Foundation: www.kidney.org Summary The DASH eating plan is a healthy eating plan that has been shown to reduce high blood pressure (hypertension). It may also reduce your risk for type 2 diabetes, heart disease, and stroke. When on the DASH eating plan, aim to eat more fresh fruits and vegetables, whole grains, lean proteins, low-fat dairy, and heart-healthy fats. With the DASH eating plan, you should limit salt (sodium) intake to 2,300   mg a day. If you have hypertension, you may need to reduce your sodium intake to 1,500 mg a day. Work with your health care provider or dietitian to adjust  your eating plan to your individual calorie needs. This information is not intended to replace advice given to you by your health care provider. Make sure you discuss any questions you have with your healthcare provider. Document Revised: 10/09/2019 Document Reviewed: 10/09/2019 Elsevier Patient Education  2022 Elsevier Inc.  

## 2021-05-16 NOTE — Progress Notes (Signed)
   Subjective:    Patient ID: Alicia Wu, female    DOB: Aug 24, 1951, 70 y.o.   MRN: [id number]  Hypertension This is a chronic problem. The current episode started more than 1 year ago. Risk factors for coronary artery disease include dyslipidemia and post-menopausal state. Treatments tried: norvasc. There are no compliance problems.    Patient has been walking up to an hour per day has not been doing as well with her diet with increase not a carbohydrates has gained some weight Essential hypertension, benign - Plan: Hepatic function panel, Basic metabolic panel  Other hyperlipidemia - Plan: Lipid panel, Hepatic function panel, Basic metabolic panel  High risk medication use - Plan: Hepatic function panel, Basic metabolic panel  Review of Systems     Objective:   Physical Exam  General-in no acute distress Eyes-no discharge Lungs-respiratory rate normal, CTA CV-no murmurs,RRR Extremities skin warm dry no edema Neuro grossly normal Behavior normal, alert       Assessment & Plan:  1. Essential hypertension, benign Blood pressure good control continue current measures watch diet minimize salt continue medicines increase activity try to bring weight down - Hepatic function panel - Basic metabolic panel  2. Other hyperlipidemia Check lab work continue medication watch diet stay active - Lipid panel - Hepatic function panel - Basic metabolic panel  3. High risk medication use Check labs - Hepatic function panel - Basic metabolic panel  Mild obesity watch portions stay active try to lose weight follow-up again in 6 months

## 2021-05-17 ENCOUNTER — Encounter: Payer: Self-pay | Admitting: Family Medicine

## 2021-05-17 LAB — BASIC METABOLIC PANEL
BUN/Creatinine Ratio: 11 — ABNORMAL LOW (ref 12–28)
BUN: 11 mg/dL (ref 8–27)
CO2: 22 mmol/L (ref 20–29)
Calcium: 9.8 mg/dL (ref 8.7–10.3)
Chloride: 102 mmol/L (ref 96–106)
Creatinine, Ser: 1.01 mg/dL — ABNORMAL HIGH (ref 0.57–1.00)
Glucose: 93 mg/dL (ref 65–99)
Potassium: 4 mmol/L (ref 3.5–5.2)
Sodium: 140 mmol/L (ref 134–144)
eGFR: 60 mL/min/{1.73_m2} (ref >59)

## 2021-05-17 LAB — HEPATIC FUNCTION PANEL
ALT: 22 IU/L (ref 0–32)
AST: 30 IU/L (ref 0–40)
Albumin: 5 g/dL — ABNORMAL HIGH (ref 3.8–4.8)
Alkaline Phosphatase: 89 IU/L (ref 44–121)
Bilirubin Total: 0.4 mg/dL (ref 0.0–1.2)
Bilirubin, Direct: 0.12 mg/dL (ref 0.00–0.40)
Total Protein: 7.2 g/dL (ref 6.0–8.5)

## 2021-05-17 LAB — LIPID PANEL
Chol/HDL Ratio: 2.2 ratio (ref 0.0–4.4)
Cholesterol, Total: 219 mg/dL — ABNORMAL HIGH (ref 100–199)
HDL: 101 mg/dL (ref >39)
LDL Chol Calc (NIH): 106 mg/dL — ABNORMAL HIGH (ref 0–99)
Triglycerides: 66 mg/dL (ref 0–149)
VLDL Cholesterol Cal: 12 mg/dL (ref 5–40)

## 2021-06-09 ENCOUNTER — Other Ambulatory Visit (HOSPITAL_COMMUNITY): Payer: Self-pay | Admitting: Family Medicine

## 2021-06-09 DIAGNOSIS — Z1231 Encounter for screening mammogram for malignant neoplasm of breast: Secondary | ICD-10-CM

## 2021-07-26 ENCOUNTER — Other Ambulatory Visit: Payer: Self-pay

## 2021-07-26 ENCOUNTER — Ambulatory Visit (HOSPITAL_COMMUNITY)
Admission: RE | Admit: 2021-07-26 | Discharge: 2021-07-26 | Disposition: A | Discharge status: Home / Self Care | Payer: Medicare Other | Source: Ambulatory Visit | Attending: Family Medicine | Admitting: Family Medicine

## 2021-07-26 DIAGNOSIS — Z1231 Encounter for screening mammogram for malignant neoplasm of breast: Secondary | ICD-10-CM

## 2021-08-10 DIAGNOSIS — Z23 Encounter for immunization: Secondary | ICD-10-CM | POA: Diagnosis not present

## 2021-09-05 DIAGNOSIS — H401191 Primary open-angle glaucoma, unspecified eye, mild stage: Secondary | ICD-10-CM | POA: Diagnosis not present

## 2021-11-01 ENCOUNTER — Other Ambulatory Visit: Payer: Self-pay | Admitting: Family Medicine

## 2021-12-06 ENCOUNTER — Encounter: Payer: Self-pay | Admitting: Family Medicine

## 2021-12-06 ENCOUNTER — Ambulatory Visit (INDEPENDENT_AMBULATORY_CARE_PROVIDER_SITE_OTHER): Payer: Medicare Other | Admitting: Family Medicine

## 2021-12-06 ENCOUNTER — Other Ambulatory Visit: Payer: Self-pay

## 2021-12-06 VITALS — BP 138/86 | HR 97 | Temp 97.3°F | Ht 66.0 in | Wt 190.0 lb

## 2021-12-06 DIAGNOSIS — I10 Essential (primary) hypertension: Secondary | ICD-10-CM

## 2021-12-06 DIAGNOSIS — E7849 Other hyperlipidemia: Secondary | ICD-10-CM

## 2021-12-06 MED ORDER — AMLODIPINE BESYLATE 10 MG PO TABS: ORAL_TABLET | ORAL | 1 refills | Status: DC

## 2021-12-06 MED ORDER — PRAVASTATIN SODIUM 40 MG PO TABS: ORAL_TABLET | ORAL | 3 refills | Status: DC

## 2021-12-06 NOTE — Patient Instructions (Addendum)
Hi Eduarda It was good to see you today.  Continue to eat healthy and stay active.  Hopefully shingles vaccine should be covered by Medicare, March of this year.  We will do blood work at your follow-up visit in the summer.  Call us if any problems [facility]-Dr. Lorin Picket   Shingrix and shingles prevention: know the facts!   Shingrix is a very effective vaccine to prevent shingles.   Shingles is a reactivation of chickenpox -more than 99% of Americans born before 1980 have had chickenpox even if they do not remember it. One in every 10 people who get shingles have severe long-lasting nerve pain as a result.   33 out of a 100 older adults will get shingles if they are unvaccinated.     This vaccine is very important for your health This vaccine is indicated for anyone 50 years or older. You can get this vaccine even if you have already had shingles because you can get the disease more than once in a lifetime.  Your risk for shingles and its complications increases with age.  This vaccine has 2 doses.  The second dose would be 2 to 6 months after the first dose.  If you had Zostavax vaccine in the past you should still get Shingrix. ( Zostavax is only 70% effective and it loses significant strength over a few years .)  This vaccine is given through the pharmacy.  The cost of the vaccine is through your insurance. The pharmacy can inform you of the total costs.  Common side effects including soreness in the arm, some redness and swelling, also some feel fatigue muscle soreness headache low-grade fever.  Side effects typically go away within 2 to 3 days. Remember-the pain from shingles can last a lifetime but these side effects of the vaccine will only last a few days at most. It is very important to get both doses in order to protect yourself fully.   Please get this vaccine at your earliest convenience at your trusted pharmacy.

## 2021-12-06 NOTE — Progress Notes (Signed)
° °  Subjective:    Patient ID: Alicia Wu, female    DOB: 1951/09/04, 71 y.o.   MRN: [id number]  Hypertension This is a chronic problem. The problem is controlled. Treatments tried: amlodipine.  Patient doing a great job with her meds Staying active Eating well Denies chest pain shortness of breath Try to keep her weight and check Watching salt   Review of Systems     Objective:   Physical Exam  General-in no acute distress Eyes-no discharge Lungs-respiratory rate normal, CTA CV-no murmurs,RRR Extremities skin warm dry no edema Neuro grossly normal Behavior normal, alert       Assessment & Plan:  Doing well Continue blood pressure medicine Regular physical activity try to bring weight down Lipid profile good Does not need labs currently

## 2021-12-19 ENCOUNTER — Telehealth: Payer: Self-pay | Admitting: Family Medicine

## 2021-12-19 NOTE — Telephone Encounter (Signed)
Left message for patient to call back and schedule Medicare Annual Wellness Visit (AWV) in office.   If unable to come into the office for AWV,  please offer to do virtually or by telephone.  Last AWV:  12/16/2020  Please schedule at anytime with [facility] Advisor.  40 minute appointment  Any questions, please contact me at [phone]

## 2021-12-19 NOTE — Telephone Encounter (Signed)
Copied from [place] [id number]. Topic: Medicare AWV >> Dec 19, 2021  2:09 PM Cher Nakai R wrote: Reason for CRM:  Left message for patient to call back and schedule Medicare Annual Wellness Visit (AWV) in office.   If not able to come in office, please offer to do virtually or by telephone.   Last AWV: 04/04/2020   Please schedule at anytime with [facility] Health Advisor.  If any questions, please contact me at [phone]

## 2022-01-09 ENCOUNTER — Ambulatory Visit (INDEPENDENT_AMBULATORY_CARE_PROVIDER_SITE_OTHER): Payer: Medicare Other

## 2022-01-09 ENCOUNTER — Other Ambulatory Visit: Payer: Self-pay

## 2022-01-09 VITALS — BP 132/86 | HR 88 | Ht 66.5 in | Wt 194.4 lb

## 2022-01-09 DIAGNOSIS — N8110 Cystocele, unspecified: Secondary | ICD-10-CM | POA: Insufficient documentation

## 2022-01-09 DIAGNOSIS — Z0000 Encounter for general adult medical examination without abnormal findings: Secondary | ICD-10-CM | POA: Diagnosis not present

## 2022-01-09 NOTE — Progress Notes (Signed)
Subjective:   Adriana SimasJudy M Piccininni is a 71 y.o. female who presents for Medicare Annual (Subsequent) preventive examination.  Review of Systems     Cardiac Risk Factors include: advanced age (>4755men, 27>65 women);hypertension;dyslipidemia;sedentary lifestyle     Objective:    Today's Vitals   01/09/22 1030  BP: 132/86  Pulse: 88  SpO2: 99%  Weight: 194 lb 6.4 oz (88.2 kg)  Height: 5' 6.5" (1.689 m)   Body mass index is 30.91 kg/m.  Advanced Directives 01/09/2022 06/03/2015  Does Patient Have a Medical Advance Directive? No No  Would patient like information on creating a medical advance directive? No - Patient declined No - patient declined information    Current Medications (verified) Outpatient Encounter Medications as of 01/09/2022  Medication Sig   amLODipine (NORVASC) 10 MG tablet TAKE 1 TABLET BY MOUTH  DAILY   latanoprost (XALATAN) 0.005 % ophthalmic solution INSTILL 1 DROP INTO INTO BOTH EYES AT BEDTIME   Multiple Vitamins-Minerals (ONE-A-DAY 50 PLUS) TABS Take by mouth.   pravastatin (PRAVACHOL) 40 MG tablet TAKE 1 TABLET BY MOUTH  DAILY IN THE EVENING   No facility-administered encounter medications on file as of 01/09/2022.    Allergies (verified) Lisinopril and Sulfa antibiotics   History: Past Medical History:  Diagnosis Date   Glaucoma    Hyperlipidemia    Hypertension    Past Surgical History:  Procedure Laterality Date   ABDOMINAL HYSTERECTOMY     COLONOSCOPY  2006   COLONOSCOPY N/A 06/03/2015   Procedure: COLONOSCOPY;  Surgeon: Corbin Adeobert M Rourk, MD;  Location: [facility];  Service: Endoscopy;  Laterality: N/A;  11:30 AM   No family history on file. Social History   Socioeconomic History   Marital status: Widowed    Spouse name: Not on file   Number of children: Not on file   Years of education: Not on file   Highest education level: Not on file  Occupational History   Not on file  Tobacco Use   Smoking status: Former    Types: Cigarettes     Quit date: 03/13/1994    Years since quitting: 27.8   Smokeless tobacco: Never  Vaping Use   Vaping Use: Never used  Substance and Sexual Activity   Alcohol use: No   Drug use: No   Sexual activity: Not Currently  Other Topics Concern   Not on file  Social History Narrative   Not on file   Social Determinants of Health   Financial Resource Strain: Low Risk    Difficulty of Paying Living Expenses: Not hard at all  Food Insecurity: No Food Insecurity   Worried About Programme researcher, broadcasting/film/videounning Out of Food in the Last Year: Never true   Ran Out of Food in the Last Year: Never true  Transportation Needs: No Transportation Needs   Lack of Transportation (Medical): No   Lack of Transportation (Non-Medical): No  Physical Activity: Sufficiently Active   Days of Exercise per Week: 5 days   Minutes of Exercise per Session: 60 min  Stress: No Stress Concern Present   Feeling of Stress : Not at all  Social Connections: Moderately Integrated   Frequency of Communication with Friends and Family: More than three times a week   Frequency of Social Gatherings with Friends and Family: More than three times a week   Attends Religious Services: More than 4 times per year   Active Member of [employer] or Organizations: Yes   Attends BankerClub or Organization Meetings: More than  4 times per year   Marital Status: Widowed    Tobacco Counseling Counseling given: Not Answered   Clinical Intake:  Pre-visit preparation completed: Yes  Pain : No/denies pain     BMI - recorded: 30.91 Nutritional Status: BMI > 30  Obese Nutritional Risks: None Diabetes: No  How often do you need to have someone help you when you read instructions, pamphlets, or other written materials from your doctor or pharmacy?: 1 - Never  Diabetic?No  Interpreter Needed?: No  Information entered by :: mj Samanda Buske, lpn   Activities of Daily Living In your present state of health, do you have any difficulty performing the following activities:  01/09/2022  Hearing? N  Vision? N  Difficulty concentrating or making decisions? N  Walking or climbing stairs? N  Dressing or bathing? N  Doing errands, shopping? N  Preparing Food and eating ? N  Using the Toilet? N  In the past six months, have you accidently leaked urine? N  Do you have problems with loss of bowel control? N  Managing your Medications? N  Managing your Finances? N  Some recent data might be hidden    Patient Care Team: Babs Sciara, MD as PCP - General (Family Medicine)  Indicate any recent Medical Services you may have received from other than [facility] providers in the past year (date may be approximate).     Assessment:   This is a routine wellness examination for Daziah.  Hearing/Vision screen Hearing Screening - Comments:: No hearing issues. Vision Screening - Comments:: Readers. Dr. Charise Killian. 01/2022.  Dietary issues and exercise activities discussed: Current Exercise Habits: Home exercise routine, Type of exercise: walking, Time (Minutes): 60, Frequency (Times/Week): 5, Weekly Exercise (Minutes/Week): 300, Intensity: Mild, Exercise limited by: cardiac condition(s)   Goals Addressed             This Visit's Progress    Exercise 3x per week (30 min per time)       Continue to exercise and stay healthy. Move to assisted living.       Depression Screen PHQ 2/9 Scores 01/09/2022 05/16/2021 12/16/2020 11/16/2020 09/10/2017 03/07/2016 08/03/2015  PHQ - 2 Score 0 0 0 0 0 0 0    Fall Risk Fall Risk  01/09/2022 05/16/2021 12/16/2020 11/16/2020 05/17/2020  Falls in the past year? 0 0 0 0 0  Comment - - - - -  Number falls in past yr: 0 - - - -  Injury with Fall? 0 - - - -  Risk for fall due to : No Fall Risks No Fall Risks - - -  Follow up Falls prevention discussed Falls evaluation completed Falls evaluation completed Falls evaluation completed Falls evaluation completed    FALL RISK PREVENTION PERTAINING TO THE HOME:  Any stairs in or around the home?  Yes  If so, are there any without handrails? No  Home free of loose throw rugs in walkways, pet beds, electrical cords, etc? Yes  Adequate lighting in your home to reduce risk of falls? Yes   ASSISTIVE DEVICES UTILIZED TO PREVENT FALLS:  Life alert? No  Use of a cane, walker or w/c? No  Grab bars in the bathroom? No  Shower chair or bench in shower? No  Elevated toilet seat or a handicapped toilet? Yes   TIMED UP AND GO:  Was the test performed? Yes .  Length of time to ambulate 10 feet: 10 sec.   Gait steady and fast without use of assistive device  Cognitive Function:     6CIT Screen 01/09/2022  What Year? 0 points  What month? 0 points  What time? 0 points  Count back from 20 0 points  Months in reverse 0 points  Repeat phrase 2 points  Total Score 2    Immunizations Immunization History  Administered Date(s) Administered   Fluad Quad(high Dose 65+) 08/01/2021   Influenza, High Dose Seasonal PF 08/28/2017, 08/25/2018, 08/22/2019   Influenza-Unspecified 08/24/2014, 08/03/2015, 08/16/2015, 08/28/2016, 08/25/2018, 08/03/2020   Moderna Sars-Covid-2 Vaccination 01/21/2020, 02/23/2020, 09/24/2020   Pneumococcal Conjugate-13 09/02/2015, 08/25/2018   Pneumococcal Polysaccharide-23 09/06/2016   Zoster, Live 10/04/2015    TDAP status: Due, Education has been provided regarding the importance of this vaccine. Advised may receive this vaccine at local pharmacy or Health Dept. Aware to provide a copy of the vaccination record if obtained from local pharmacy or Health Dept. Verbalized acceptance and understanding.  Flu Vaccine status: Up to date  Pneumococcal vaccine status: Up to date  Covid-19 vaccine status: Completed vaccines  Qualifies for Shingles Vaccine? Yes   Zostavax completed Yes   Shingrix Completed?: No.    Education has been provided regarding the importance of this vaccine. Patient has been advised to call insurance company to determine out of pocket expense  if they have not yet received this vaccine. Advised may also receive vaccine at local pharmacy or Health Dept. Verbalized acceptance and understanding.  Screening Tests Health Maintenance  Topic Date Due   TETANUS/TDAP  Never done   Zoster Vaccines- Shingrix (1 of 2) Never done   COVID-19 Vaccine (4 - Booster for Moderna series) 11/19/2020   MAMMOGRAM  07/27/2023   COLONOSCOPY (Pts 45-13yrs Insurance coverage will need to be confirmed)  06/02/2025   Pneumonia Vaccine 45+ Years old  Completed   INFLUENZA VACCINE  Completed   DEXA SCAN  Completed   Hepatitis C Screening  Completed   HPV VACCINES  Aged Out    Health Maintenance  Health Maintenance Due  Topic Date Due   TETANUS/TDAP  Never done   Zoster Vaccines- Shingrix (1 of 2) Never done   COVID-19 Vaccine (4 - Booster for Moderna series) 11/19/2020    Colorectal cancer screening: Type of screening: Colonoscopy. Completed 06/03/2015. Repeat every 10 years  Mammogram status: Completed 07/26/2021. Repeat every year  Bone Density status: Completed 05/02/2021. Results reflect: Bone density results: NORMAL. Repeat every 2 years.  Lung Cancer Screening: (Low Dose CT Chest recommended if Age 70-80 years, 30 pack-year currently smoking OR have quit w/in 15years.) does not qualify.    Additional Screening:  Hepatitis C Screening: does qualify; Completed 03/07/2017  Vision Screening: Recommended annual ophthalmology exams for early detection of glaucoma and other disorders of the eye. Is the patient up to date with their annual eye exam?  Yes  Who is the provider or what is the name of the office in which the patient attends annual eye exams? Dr. Charise Killian If pt is not established with a provider, would they like to be referred to a provider to establish care? No .   Dental Screening: Recommended annual dental exams for proper oral hygiene  Community Resource Referral / Chronic Care Management: CRR required this visit?  No   CCM  required this visit?  No      Plan:     I have personally reviewed and noted the following in the patients chart:   Medical and social history Use of alcohol, tobacco or illicit drugs  Current medications and supplements  including opioid prescriptions.  Functional ability and status Nutritional status Physical activity Advanced directives List of other physicians Hospitalizations, surgeries, and ER visits in previous 12 months Vitals Screenings to include cognitive, depression, and falls Referrals and appointments  In addition, I have reviewed and discussed with patient certain preventive protocols, quality metrics, and best practice recommendations. A written personalized care plan for preventive services as well as general preventive health recommendations were provided to patient.     Darral Dash, LPN   03/16/7680   Nurse Notes: Pt is up to date on health maintenance. Discussed Shingrix and Tdap and how to obtain.

## 2022-01-09 NOTE — Patient Instructions (Signed)
Alicia Wu , Thank you for taking time to come for your Medicare Wellness Visit. I appreciate your ongoing commitment to your health goals. Please review the following plan we discussed and let me know if I can assist you in the future.   Screening recommendations/referrals: Colonoscopy: Done 06/03/2015 Repeat in 10 years  Mammogram: Done 07/26/2021 Repeat annually  Bone Density: Done 05/02/2021 Repeat every 2 years  Recommended yearly ophthalmology/optometry visit for glaucoma screening and checkup Recommended yearly dental visit for hygiene and checkup  Vaccinations: Influenza vaccine: Done 08/01/2021 Repeat annually  Pneumococcal vaccine: Done 08/25/2018 and 11/16/2020 Tdap vaccine: Due Repeat in 10 years  Shingles vaccine: Done 10/04/2015. Discussed new Shingrix, check with pharmacy.   Covid-19:Done 01/21/2020, 02/23/2020, and 09/24/2020  Advanced directives: Advance directive discussed with you today. I have provided a copy for you to complete at home and have notarized. Once this is complete please bring a copy in to our office so we can scan it into your chart.   Conditions/risks identified: KEEP UP THE GOOD WORK!!  Next appointment: Follow up in one year for your annual wellness visit 2024.   Preventive Care 72 Years and Older, Female Preventive care refers to lifestyle choices and visits with your health care provider that can promote health and wellness. What does preventive care include? A yearly physical exam. This is also called an annual well check. Dental exams once or twice a year. Routine eye exams. Ask your health care provider how often you should have your eyes checked. Personal lifestyle choices, including: Daily care of your teeth and gums. Regular physical activity. Eating a healthy diet. Avoiding tobacco and drug use. Limiting alcohol use. Practicing safe sex. Taking low-dose aspirin every day. Taking vitamin and mineral supplements as recommended by your  health care provider. What happens during an annual well check? The services and screenings done by your health care provider during your annual well check will depend on your age, overall health, lifestyle risk factors, and family history of disease. Counseling  Your health care provider may ask you questions about your: Alcohol use. Tobacco use. Drug use. Emotional well-being. Home and relationship well-being. Sexual activity. Eating habits. History of falls. Memory and ability to understand (cognition). Work and work Statistician. Reproductive health. Screening  You may have the following tests or measurements: Height, weight, and BMI. Blood pressure. Lipid and cholesterol levels. These may be checked every 5 years, or more frequently if you are over 43 years old. Skin check. Lung cancer screening. You may have this screening every year starting at age 72 if you have a 30-pack-year history of smoking and currently smoke or have quit within the past 15 years. Fecal occult blood test (FOBT) of the stool. You may have this test every year starting at age 72 Flexible sigmoidoscopy or colonoscopy. You may have a sigmoidoscopy every 5 years or a colonoscopy every 10 years starting at age 63. Hepatitis C blood test. Hepatitis B blood test. Sexually transmitted disease (STD) testing. Diabetes screening. This is done by checking your blood sugar (glucose) after you have not eaten for a while (fasting). You may have this done every 1-3 years. Bone density scan. This is done to screen for osteoporosis. You may have this done starting at age 72. Mammogram. This may be done every 72-2 years. Talk to your health care provider about how often you should have regular mammograms. Talk with your health care provider about your test results, treatment options, and if necessary, the need  for more tests. Vaccines  Your health care provider may recommend certain vaccines, such as: Influenza vaccine.  This is recommended every year. Tetanus, diphtheria, and acellular pertussis (Tdap, Td) vaccine. You may need a Td booster every 10 years. Zoster vaccine. You may need this after age 72. Pneumococcal 13-valent conjugate. One dose is recommended after age 72 (PCV13) vaccine. One dose is recommended after age 96. Pneumococcal polysaccharide (PPSV23) vaccine. One dose is recommended after age 72. Talk to your health care provider about which screenings and vaccines you need and how often you need them. This information is not intended to replace advice given to you by your health care provider. Make sure you discuss any questions you have with your health care provider. Document Released: 12/02/2015 Document Revised: 07/25/2016 Document Reviewed: 09/06/2015 Elsevier Interactive Patient Education  2017 [place] Prevention in the Home Falls can cause injuries. They can happen to people of all ages. There are many things you can do to make your home safe and to help prevent falls. What can I do on the outside of my home? Regularly fix the edges of walkways and driveways and fix any cracks. Remove anything that might make you trip as you walk through a door, such as a raised step or threshold. Trim any bushes or trees on the path to your home. Use bright outdoor lighting. Clear any walking paths of anything that might make someone trip, such as rocks or tools. Regularly check to see if handrails are loose or broken. Make sure that both sides of any steps have handrails. Any raised decks and porches should have guardrails on the edges. Have any leaves, snow, or ice cleared regularly. Use sand or salt on walking paths during winter. Clean up any spills in your garage right away. This includes oil or grease spills. What can I do in the bathroom? Use night lights. Install grab bars by the toilet and in the tub and shower. Do not use towel bars as grab bars. Use non-skid mats or decals in the tub or shower. If you need to sit  down in the shower, use a plastic, non-slip stool. Keep the floor dry. Clean up any water that spills on the floor as soon as it happens. Remove soap buildup in the tub or shower regularly. Attach bath mats securely with double-sided non-slip rug tape. Do not have throw rugs and other things on the floor that can make you trip. What can I do in the bedroom? Use night lights. Make sure that you have a light by your bed that is easy to reach. Do not use any sheets or blankets that are too big for your bed. They should not hang down onto the floor. Have a firm chair that has side arms. You can use this for support while you get dressed. Do not have throw rugs and other things on the floor that can make you trip. What can I do in the kitchen? Clean up any spills right away. Avoid walking on wet floors. Keep items that you use a lot in easy-to-reach places. If you need to reach something above you, use a strong step stool that has a grab bar. Keep electrical cords out of the way. Do not use floor polish or wax that makes floors slippery. If you must use wax, use non-skid floor wax. Do not have throw rugs and other things on the floor that can make you trip. What can I do with my stairs? Do not leave any items on the stairs.  Make sure that there are handrails on both sides of the stairs and use them. Fix handrails that are broken or loose. Make sure that handrails are as long as the stairways. Check any carpeting to make sure that it is firmly attached to the stairs. Fix any carpet that is loose or worn. Avoid having throw rugs at the top or bottom of the stairs. If you do have throw rugs, attach them to the floor with carpet tape. Make sure that you have a light switch at the top of the stairs and the bottom of the stairs. If you do not have them, ask someone to add them for you. What else can I do to help prevent falls? Wear shoes that: Do not have high heels. Have rubber bottoms. Are  comfortable and fit you well. Are closed at the toe. Do not wear sandals. If you use a stepladder: Make sure that it is fully opened. Do not climb a closed stepladder. Make sure that both sides of the stepladder are locked into place. Ask someone to hold it for you, if possible. Clearly mark and make sure that you can see: Any grab bars or handrails. First and last steps. Where the edge of each step is. Use tools that help you move around (mobility aids) if they are needed. These include: Canes. Walkers. Scooters. Crutches. Turn on the lights when you go into a dark area. Replace any light bulbs as soon as they burn out. Set up your furniture so you have a clear path. Avoid moving your furniture around. If any of your floors are uneven, fix them. If there are any pets around you, be aware of where they are. Review your medicines with your doctor. Some medicines can make you feel dizzy. This can increase your chance of falling. Ask your doctor what other things that you can do to help prevent falls. This information is not intended to replace advice given to you by your health care provider. Make sure you discuss any questions you have with your health care provider. Document Released: 09/01/2009 Document Revised: 04/12/2016 Document Reviewed: 12/10/2014 Elsevier Interactive Patient Education  2017 [employer].

## 2022-02-21 ENCOUNTER — Other Ambulatory Visit: Payer: Self-pay | Admitting: Family Medicine

## 2022-03-07 DIAGNOSIS — H401131 Primary open-angle glaucoma, bilateral, mild stage: Secondary | ICD-10-CM | POA: Diagnosis not present

## 2022-06-06 ENCOUNTER — Ambulatory Visit: Payer: Medicare Other | Admitting: Family Medicine

## 2022-06-11 ENCOUNTER — Ambulatory Visit (INDEPENDENT_AMBULATORY_CARE_PROVIDER_SITE_OTHER): Payer: Medicare Other | Admitting: Family Medicine

## 2022-06-11 ENCOUNTER — Encounter: Payer: Self-pay | Admitting: Family Medicine

## 2022-06-11 VITALS — BP 132/80 | HR 74 | Temp 97.5°F | Ht 66.5 in | Wt 192.0 lb

## 2022-06-11 DIAGNOSIS — I10 Essential (primary) hypertension: Secondary | ICD-10-CM

## 2022-06-11 DIAGNOSIS — E7849 Other hyperlipidemia: Secondary | ICD-10-CM | POA: Diagnosis not present

## 2022-06-11 MED ORDER — AMLODIPINE BESYLATE 10 MG PO TABS: 10.0000 mg | ORAL_TABLET | Freq: Every day | ORAL | 1 refills | Status: DC

## 2022-06-11 NOTE — Progress Notes (Signed)
   Subjective:    Patient ID: Alicia Wu, female    DOB: 08-20-1951, 71 y.o.   MRN: [id number]  Hypertension This is a chronic problem. Treatments tried: amlodipine.  Hyperlipidemia This is a chronic problem. Treatments tried: pravastatin.   6 Month follow up , voices no concerns Patient trying to watch her diet Taking her medication Trying to stay active Watching how much calories she is taking in Weight is stable Walking 50 to 60 minutes every day  Review of Systems     Objective:   Physical Exam General-in no acute distress Eyes-no discharge Lungs-respiratory rate normal, CTA CV-no murmurs,RRR Extremities skin warm dry no edema Neuro grossly normal Behavior normal, alert   Cognitive assessment patient is able to recall 2 for 3 items after distraction clock drawing fine     Assessment & Plan:  1. Essential hypertension, benign Continue med Check lab work [facility] regular activity - Lipid panel - Hepatic Function Panel - Basic metabolic panel  2. Other hyperlipidemia Healthy diet regular activity continue medication - Lipid panel - Hepatic Function Panel - Basic metabolic panel  Weight-BMI is 30 but overall she is in good health recommend regular physical activity

## 2022-06-11 NOTE — Patient Instructions (Signed)

## 2022-06-12 ENCOUNTER — Encounter: Payer: Self-pay | Admitting: Family Medicine

## 2022-06-12 LAB — LIPID PANEL
Chol/HDL Ratio: 2.1 ratio (ref 0.0–4.4)
Cholesterol, Total: 216 mg/dL — ABNORMAL HIGH (ref 100–199)
HDL: 103 mg/dL (ref >39)
LDL Chol Calc (NIH): 102 mg/dL — ABNORMAL HIGH (ref 0–99)
Triglycerides: 62 mg/dL (ref 0–149)
VLDL Cholesterol Cal: 11 mg/dL (ref 5–40)

## 2022-06-12 LAB — BASIC METABOLIC PANEL
BUN/Creatinine Ratio: 10 — ABNORMAL LOW (ref 12–28)
BUN: 10 mg/dL (ref 8–27)
CO2: 23 mmol/L (ref 20–29)
Calcium: 9.6 mg/dL (ref 8.7–10.3)
Chloride: 105 mmol/L (ref 96–106)
Creatinine, Ser: 0.97 mg/dL (ref 0.57–1.00)
Glucose: 82 mg/dL (ref 70–99)
Potassium: 3.9 mmol/L (ref 3.5–5.2)
Sodium: 142 mmol/L (ref 134–144)
eGFR: 62 mL/min/{1.73_m2} (ref >59)

## 2022-06-12 LAB — HEPATIC FUNCTION PANEL
ALT: 22 IU/L (ref 0–32)
AST: 31 IU/L (ref 0–40)
Albumin: 4.7 g/dL (ref 3.8–4.8)
Alkaline Phosphatase: 87 IU/L (ref 44–121)
Bilirubin Total: 0.5 mg/dL (ref 0.0–1.2)
Bilirubin, Direct: 0.13 mg/dL (ref 0.00–0.40)
Total Protein: 7.5 g/dL (ref 6.0–8.5)

## 2022-06-12 NOTE — Progress Notes (Signed)
Please mail to patient thank you 

## 2022-06-14 ENCOUNTER — Other Ambulatory Visit (HOSPITAL_COMMUNITY): Payer: Self-pay | Admitting: Family Medicine

## 2022-06-14 DIAGNOSIS — Z1231 Encounter for screening mammogram for malignant neoplasm of breast: Secondary | ICD-10-CM

## 2022-07-26 ENCOUNTER — Other Ambulatory Visit: Payer: Self-pay | Admitting: Family Medicine

## 2022-07-30 ENCOUNTER — Ambulatory Visit (HOSPITAL_COMMUNITY)
Admission: RE | Admit: 2022-07-30 | Discharge: 2022-07-30 | Disposition: A | Discharge status: Home / Self Care | Payer: Medicare Other | Source: Ambulatory Visit | Attending: Family Medicine | Admitting: Family Medicine

## 2022-07-30 DIAGNOSIS — Z1231 Encounter for screening mammogram for malignant neoplasm of breast: Secondary | ICD-10-CM | POA: Insufficient documentation

## 2022-09-11 DIAGNOSIS — H401191 Primary open-angle glaucoma, unspecified eye, mild stage: Secondary | ICD-10-CM | POA: Diagnosis not present

## 2022-09-22 ENCOUNTER — Other Ambulatory Visit: Payer: Self-pay | Admitting: Family Medicine

## 2022-10-26 ENCOUNTER — Other Ambulatory Visit: Payer: Self-pay | Admitting: Family Medicine

## 2022-12-12 ENCOUNTER — Encounter: Payer: Self-pay | Admitting: Family Medicine

## 2022-12-12 ENCOUNTER — Ambulatory Visit (INDEPENDENT_AMBULATORY_CARE_PROVIDER_SITE_OTHER): Payer: Medicare Other | Admitting: Family Medicine

## 2022-12-12 VITALS — BP 145/88 | HR 76 | Temp 97.3°F | Ht 66.5 in | Wt 193.0 lb

## 2022-12-12 DIAGNOSIS — I10 Essential (primary) hypertension: Secondary | ICD-10-CM | POA: Diagnosis not present

## 2022-12-12 DIAGNOSIS — E7849 Other hyperlipidemia: Secondary | ICD-10-CM

## 2022-12-12 NOTE — Progress Notes (Signed)
   Subjective:    Patient ID: Alicia Wu, female    DOB: 15-Jul-1951, 72 y.o.   MRN: [id number]  HPI Hypertension follow up takes medications daily around 1 pm  She has not been exercising the way she normally does She is trying to eat somewhat healthy Staying active but not doing much in the way of walking Takes her blood pressure medicine Denies any side effects History hypertension  Review of Systems     Objective:   Physical Exam  General-in no acute distress Eyes-no discharge Lungs-respiratory rate normal, CTA CV-no murmurs,RRR Extremities skin warm dry no edema Neuro grossly normal Behavior normal, alert       Assessment & Plan:   Blood pressure good control up until recently not walking enough will fit in walking in a regular basis follow-up again in 8 to 10 weeks to recheck blood pressure  She will continue her current medications Lab work ordered

## 2022-12-12 NOTE — Patient Instructions (Signed)
Labs in 4 to 6 weeks

## 2023-01-09 ENCOUNTER — Encounter: Payer: Medicare Other | Admitting: Family Medicine

## 2023-01-17 NOTE — Patient Instructions (Addendum)
Alicia Wu , Thank you for taking time to come for your Medicare Wellness Visit. I appreciate your ongoing commitment to your health goals. Please review the following plan we discussed and let me know if I can assist you in the future.  m These are the goals we discussed:  Goals      Exercise 3x per week (30 min per time)     Continue to exercise and stay healthy. Move to assisted living.        This is a list of the screening recommended for you and due dates:  Health Maintenance  Topic Date Due   COVID-19 Vaccine (5 - 2023-24 season) 10/31/2022   Medicare Annual Wellness Visit  01/09/2023   Mammogram  07/30/2024   Colon Cancer Screening  06/02/2025   DTaP/Tdap/Td vaccine (2 - Td or Tdap) 01/24/2032   Pneumonia Vaccine  Completed   Flu Shot  Completed   DEXA scan (bone density measurement)  Completed   Hepatitis C Screening: USPSTF Recommendation to screen - Ages 13-79 yo.  Completed   Zoster (Shingles) Vaccine  Completed   HPV Vaccine  Aged Out    Advanced directives: We have a copy of your advanced directives available in your record should your provider ever need to access them.   Conditions/risks identified: Aim for 30 minutes of exercise or brisk walking, 6-8 glasses of water, and 5 servings of fruits and vegetables each day.   Next appointment: Follow up in one year for your annual wellness visit    Preventive Care 65 Years and Older, Female Preventive care refers to lifestyle choices and visits with your health care provider that can promote health and wellness. What does preventive care include? A yearly physical exam. This is also called an annual well check. Dental exams once or twice a year. Routine eye exams. Ask your health care provider how often you should have your eyes checked. Personal lifestyle choices, including: Daily care of your teeth and gums. Regular physical activity. Eating a healthy diet. Avoiding tobacco and drug use. Limiting alcohol  use. Practicing safe sex. Taking low-dose aspirin every day. Taking vitamin and mineral supplements as recommended by your health care provider. What happens during an annual well check? The services and screenings done by your health care provider during your annual well check will depend on your age, overall health, lifestyle risk factors, and family history of disease. Counseling  Your health care provider may ask you questions about your: Alcohol use. Tobacco use. Drug use. Emotional well-being. Home and relationship well-being. Sexual activity. Eating habits. History of falls. Memory and ability to understand (cognition). Work and work Statistician. Reproductive health. Screening  You may have the following tests or measurements: Height, weight, and BMI. Blood pressure. Lipid and cholesterol levels. These may be checked every 5 years, or more frequently if you are over 70 years old. Skin check. Lung cancer screening. You may have this screening every year starting at age 67 if you have a 30-pack-year history of smoking and currently smoke or have quit within the past 15 years. Fecal occult blood test (FOBT) of the stool. You may have this test every year starting at age 44. Flexible sigmoidoscopy or colonoscopy. You may have a sigmoidoscopy every 5 years or a colonoscopy every 10 years starting at age 3. Hepatitis C blood test. Hepatitis B blood test. Sexually transmitted disease (STD) testing. Diabetes screening. This is done by checking your blood sugar (glucose) after you have not eaten for  a while (fasting). You may have this done every 1-3 years. Bone density scan. This is done to screen for osteoporosis. You may have this done starting at age 72. Mammogram. This may be done every 1-2 years. Talk to your health care provider about how often you should have regular mammograms. Talk with your health care provider about your test results, treatment options, and if necessary,  the need for more tests. Vaccines  Your health care provider may recommend certain vaccines, such as: Influenza vaccine. This is recommended every year. Tetanus, diphtheria, and acellular pertussis (Tdap, Td) vaccine. You may need a Td booster every 10 years. Zoster vaccine. You may need this after age 86. Pneumococcal 13-valent conjugate (PCV13) vaccine. One dose is recommended after age 58. Pneumococcal polysaccharide (PPSV23) vaccine. One dose is recommended after age 62. Talk to your health care provider about which screenings and vaccines you need and how often you need them. This information is not intended to replace advice given to you by your health care provider. Make sure you discuss any questions you have with your health care provider. Document Released: 12/02/2015 Document Revised: 07/25/2016 Document Reviewed: 09/06/2015 Elsevier Interactive Patient Education  2017 [place] Prevention in the Home Falls can cause injuries. They can happen to people of all ages. There are many things you can do to make your home safe and to help prevent falls. What can I do on the outside of my home? Regularly fix the edges of walkways and driveways and fix any cracks. Remove anything that might make you trip as you walk through a door, such as a raised step or threshold. Trim any bushes or trees on the path to your home. Use bright outdoor lighting. Clear any walking paths of anything that might make someone trip, such as rocks or tools. Regularly check to see if handrails are loose or broken. Make sure that both sides of any steps have handrails. Any raised decks and porches should have guardrails on the edges. Have any leaves, snow, or ice cleared regularly. Use sand or salt on walking paths during winter. Clean up any spills in your garage right away. This includes oil or grease spills. What can I do in the bathroom? Use night lights. Install grab bars by the toilet and in the  tub and shower. Do not use towel bars as grab bars. Use non-skid mats or decals in the tub or shower. If you need to sit down in the shower, use a plastic, non-slip stool. Keep the floor dry. Clean up any water that spills on the floor as soon as it happens. Remove soap buildup in the tub or shower regularly. Attach bath mats securely with double-sided non-slip rug tape. Do not have throw rugs and other things on the floor that can make you trip. What can I do in the bedroom? Use night lights. Make sure that you have a light by your bed that is easy to reach. Do not use any sheets or blankets that are too big for your bed. They should not hang down onto the floor. Have a firm chair that has side arms. You can use this for support while you get dressed. Do not have throw rugs and other things on the floor that can make you trip. What can I do in the kitchen? Clean up any spills right away. Avoid walking on wet floors. Keep items that you use a lot in easy-to-reach places. If you need to reach something above  you, use a strong step stool that has a grab bar. Keep electrical cords out of the way. Do not use floor polish or wax that makes floors slippery. If you must use wax, use non-skid floor wax. Do not have throw rugs and other things on the floor that can make you trip. What can I do with my stairs? Do not leave any items on the stairs. Make sure that there are handrails on both sides of the stairs and use them. Fix handrails that are broken or loose. Make sure that handrails are as long as the stairways. Check any carpeting to make sure that it is firmly attached to the stairs. Fix any carpet that is loose or worn. Avoid having throw rugs at the top or bottom of the stairs. If you do have throw rugs, attach them to the floor with carpet tape. Make sure that you have a light switch at the top of the stairs and the bottom of the stairs. If you do not have them, ask someone to add them for  you. What else can I do to help prevent falls? Wear shoes that: Do not have high heels. Have rubber bottoms. Are comfortable and fit you well. Are closed at the toe. Do not wear sandals. If you use a stepladder: Make sure that it is fully opened. Do not climb a closed stepladder. Make sure that both sides of the stepladder are locked into place. Ask someone to hold it for you, if possible. Clearly mark and make sure that you can see: Any grab bars or handrails. First and last steps. Where the edge of each step is. Use tools that help you move around (mobility aids) if they are needed. These include: Canes. Walkers. Scooters. Crutches. Turn on the lights when you go into a dark area. Replace any light bulbs as soon as they burn out. Set up your furniture so you have a clear path. Avoid moving your furniture around. If any of your floors are uneven, fix them. If there are any pets around you, be aware of where they are. Review your medicines with your doctor. Some medicines can make you feel dizzy. This can increase your chance of falling. Ask your doctor what other things that you can do to help prevent falls. This information is not intended to replace advice given to you by your health care provider. Make sure you discuss any questions you have with your health care provider. Document Released: 09/01/2009 Document Revised: 04/12/2016 Document Reviewed: 12/10/2014 Elsevier Interactive Patient Education  2017 [employer].

## 2023-01-17 NOTE — Progress Notes (Signed)
Subjective:   Alicia Wu is a 72 y.o. female who presents for Medicare Annual (Subsequent) preventive examination.  Review of Systems     Cardiac Risk Factors include: advanced age (>49mn, >>31women);hypertension;dyslipidemia     Objective:    Today's Vitals   01/18/23 1009  BP: 138/80  Weight: 188 lb (85.3 kg)  Height: 5' 6.5" (1.689 m)   Body mass index is 29.89 kg/m.     01/18/2023   10:22 AM 01/09/2022   10:45 AM 06/03/2015   12:36 PM  Advanced Directives  Does Patient Have a Medical Advance Directive? Yes No No  Type of Advance Directive Living will;Healthcare Power of Attorney    Does patient want to make changes to medical advance directive? No - Patient declined    Copy of [place] Chart? Yes - validated most recent copy scanned in chart (See row information)    Would patient like information on creating a medical advance directive?  No - Patient declined No - patient declined information    Current Medications (verified) Outpatient Encounter Medications as of 01/18/2023  Medication Sig   amLODipine (NORVASC) 10 MG tablet TAKE 1 TABLET BY MOUTH DAILY   latanoprost (XALATAN) 0.005 % ophthalmic solution INSTILL 1 DROP INTO INTO BOTH EYES AT BEDTIME   Multiple Vitamins-Minerals (ONE-A-DAY 50 PLUS) TABS Take by mouth.   pravastatin (PRAVACHOL) 40 MG tablet TAKE 1 TABLET BY MOUTH DAILY IN  THE EVENING   [DISCONTINUED] FLUAD QUADRIVALENT 0.5 ML injection    [DISCONTINUED] SPIKEVAX injection    No facility-administered encounter medications on file as of 01/18/2023.    Allergies (verified) Lisinopril and Sulfa antibiotics   History: Past Medical History:  Diagnosis Date   Glaucoma    Hyperlipidemia    Hypertension    Past Surgical History:  Procedure Laterality Date   ABDOMINAL HYSTERECTOMY     COLONOSCOPY  2006   COLONOSCOPY N/A 06/03/2015   Procedure: COLONOSCOPY;  Surgeon: RDaneil Dolin MD;  Location: [facility];  Service:  Endoscopy;  Laterality: N/A;  11:30 AM   No family history on file. Social History   Socioeconomic History   Marital status: Widowed    Spouse name: Not on file   Number of children: Not on file   Years of education: Not on file   Highest education level: Not on file  Occupational History   Not on file  Tobacco Use   Smoking status: Former    Types: Cigarettes    Quit date: 03/13/1994    Years since quitting: 28.8   Smokeless tobacco: Never  Vaping Use   Vaping Use: Never used  Substance and Sexual Activity   Alcohol use: No   Drug use: No   Sexual activity: Not Currently  Other Topics Concern   Not on file  Social History Narrative   Not on file   Social Determinants of Health   Financial Resource Strain: Low Risk  (01/18/2023)   Overall Financial Resource Strain (CARDIA)    Difficulty of Paying Living Expenses: Not hard at all  Food Insecurity: No Food Insecurity (01/18/2023)   Hunger Vital Sign    Worried About Running Out of Food in the Last Year: Never true    [place] the Last Year: Never true  Transportation Needs: No Transportation Needs (01/18/2023)   PRAPARE - THydrologist(Medical): No    Lack of Transportation (Non-Medical): No  Physical  Activity: Sufficiently Active (01/18/2023)   Exercise Vital Sign    Days of Exercise per Week: 6 days    Minutes of Exercise per Session: 60 min  Stress: No Stress Concern Present (01/18/2023)   [place]    Feeling of Stress : Not at all  Social Connections: Moderately Integrated (01/18/2023)   Social Connection and Isolation Panel [NHANES]    Frequency of Communication with Friends and Family: More than three times a week    Frequency of Social Gatherings with Friends and Family: More than three times a week    Attends Religious Services: More than 4 times per year    Active Member of [employer] or Organizations: Yes    Attends  Archivist Meetings: More than 4 times per year    Marital Status: Widowed    Tobacco Counseling Counseling given: Not Answered   Clinical Intake:  Pre-visit preparation completed: Yes  Pain : No/denies pain  Diabetes: No  How often do you need to have someone help you when you read instructions, pamphlets, or other written materials from your doctor or pharmacy?: 1 - Never  Diabetic?No   Interpreter Needed?: No  Information entered by :: Alicia George LPN   Activities of Daily Living    01/18/2023   11:30 AM  In your present state of health, do you have any difficulty performing the following activities:  Hearing? 0  Vision? 0  Difficulty concentrating or making decisions? 0  Walking or climbing stairs? 0  Dressing or bathing? 0  Doing errands, shopping? 0  Preparing Food and eating ? N  Using the Toilet? N  In the past six months, have you accidently leaked urine? N  Do you have problems with loss of bowel control? N  Managing your Medications? N  Managing your Finances? N  Housekeeping or managing your Housekeeping? N    Patient Care Team: Kathyrn Drown, MD as PCP - General (Family Medicine)  Indicate any recent Medical Services you may have received from other than [facility] providers in the past year (date may be approximate).     Assessment:   This is a routine wellness examination for Alicia Wu.  Hearing/Vision screen Hearing Screening - Comments:: Denies hearing difficulties  Vision Screening - Comments:: Wears rx glasses - up to date with routine eye exams with Dr. Jorja Loa     Dietary issues and exercise activities discussed: Current Exercise Habits: Home exercise routine, Type of exercise: walking, Time (Minutes): 60, Frequency (Times/Week): 6, Weekly Exercise (Minutes/Week): 360, Intensity: Moderate   Goals Addressed   None    Depression Screen    01/18/2023   11:30 AM 12/12/2022   11:10 AM 06/11/2022   11:02 AM 01/09/2022   10:36 AM  05/16/2021    8:41 AM 12/16/2020   11:15 AM 11/16/2020    8:46 AM  PHQ 2/9 Scores  PHQ - 2 Score 0 0 0 0 0 0 0  PHQ- 9 Score 0 0         Fall Risk    01/18/2023   10:20 AM 12/12/2022   11:10 AM 06/11/2022   11:02 AM 01/09/2022   10:47 AM 05/16/2021    8:41 AM  Fall Risk   Falls in the past year? 0 0 0 0 0  Number falls in past yr: 0 0 0 0   Injury with Fall? 0 0 0 0   Risk for fall due to : No Fall  Risks No Fall Risks No Fall Risks No Fall Risks No Fall Risks  Follow up Falls prevention discussed Falls evaluation completed Falls evaluation completed Falls prevention discussed Falls evaluation completed    [place]:  Any stairs in or around the home? No  If so, are there any without handrails? No  Home free of loose throw rugs in walkways, pet beds, electrical cords, etc? Yes  Adequate lighting in your home to reduce risk of falls? Yes   ASSISTIVE DEVICES UTILIZED TO PREVENT FALLS:  Life alert? No  Use of a cane, walker or w/c? No  Grab bars in the bathroom? Yes  Shower chair or bench in shower? No  Elevated toilet seat or a handicapped toilet? Yes   TIMED UP AND GO:  Was the test performed? Yes .  Length of time to ambulate 10 feet: 5 sec.   Gait steady and fast without use of assistive device  Cognitive Function:        01/18/2023   10:23 AM 01/09/2022   10:52 AM  6CIT Screen  What Year? 0 points 0 points  What month? 0 points 0 points  What time? 0 points 0 points  Count back from 20 0 points 0 points  Months in reverse 0 points 0 points  Repeat phrase 0 points 2 points  Total Score 0 points 2 points    Immunizations Immunization History  Administered Date(s) Administered   Covid-19, Mrna,Vaccine(Spikevax)50yr and older 09/05/2022   Fluad Quad(high Dose 65+) 08/01/2021   Influenza, High Dose Seasonal PF 08/28/2017, 08/25/2018, 08/22/2019   Influenza-Unspecified 08/24/2014, 08/03/2015, 08/16/2015, 08/28/2016, 08/25/2018,  08/03/2020, 08/10/2022   Moderna Sars-Covid-2 Vaccination 01/21/2020, 02/23/2020, 09/24/2020   Pneumococcal Conjugate-13 09/02/2015, 08/25/2018   Pneumococcal Polysaccharide-23 09/06/2016   Tdap 01/23/2022   Zoster Recombinat (Shingrix) 01/09/2022, 07/31/2022   Zoster, Live 10/04/2015    TDAP status: Up to date  Flu Vaccine status: Up to date  Pneumococcal vaccine status: Up to date  Covid-19 vaccine status: Information provided on how to obtain vaccines.   Qualifies for Shingles Vaccine? Yes   Zostavax completed Yes   Shingrix Completed?: Yes  Screening Tests Health Maintenance  Topic Date Due   COVID-19 Vaccine (5 - 2023-24 season) 10/31/2022   Medicare Annual Wellness (AWV)  01/18/2024   MAMMOGRAM  07/30/2024   COLONOSCOPY (Pts 45-475yrInsurance coverage will need to be confirmed)  06/02/2025   DTaP/Tdap/Td (2 - Td or Tdap) 01/24/2032   Pneumonia Vaccine 73Years old  Completed   INFLUENZA VACCINE  Completed   DEXA SCAN  Completed   Hepatitis C Screening  Completed   Zoster Vaccines- Shingrix  Completed   HPV VACCINES  Aged Out    Health Maintenance  Health Maintenance Due  Topic Date Due   COVID-19 Vaccine (5 - 2023-24 season) 10/31/2022    Colorectal cancer screening: Type of screening: Colonoscopy. Completed 06/03/15. Repeat every 10 years  Mammogram status: Completed 07/30/22. Repeat every year  Bone Density status: Completed 05/02/21. Results reflect: Bone density results: NORMAL. Repeat every 2 years.  Lung Cancer Screening: (Low Dose CT Chest recommended if Age 73-80ears, 30 pack-year currently smoking OR have quit w/in 15years.) does not qualify.   Lung Cancer Screening Referral: n/a  Additional Screening:  Hepatitis C Screening: does qualify; Completed 03/07/17  Vision Screening: Recommended annual ophthalmology exams for early detection of glaucoma and other disorders of the eye. Is the patient up to date with their annual eye exam?  Yes  Who  is the provider or what is the name of the office in which the patient attends annual eye exams? Dr. Jorja Loa  If pt is not established with a provider, would they like to be referred to a provider to establish care? No .   Dental Screening: Recommended annual dental exams for proper oral hygiene  Community Resource Referral / Chronic Care Management: CRR required this visit?  No   CCM required this visit?  No      Plan:     I have personally reviewed and noted the following in the patient's chart:   Medical and social history Use of alcohol, tobacco or illicit drugs  Current medications and supplements including opioid prescriptions. Patient is not currently taking opioid prescriptions. Functional ability and status Nutritional status Physical activity Advanced directives List of other physicians Hospitalizations, surgeries, and ER visits in previous 12 months Vitals Screenings to include cognitive, depression, and falls Referrals and appointments  In addition, I have reviewed and discussed with patient certain preventive protocols, quality metrics, and best practice recommendations. A written personalized care plan for preventive services as well as general preventive health recommendations were provided to patient.     Alicia Wu [place], [place]   [id number]   Nurse Notes: No concerns

## 2023-01-18 ENCOUNTER — Ambulatory Visit (INDEPENDENT_AMBULATORY_CARE_PROVIDER_SITE_OTHER): Payer: Medicare Other

## 2023-01-18 VITALS — BP 138/80 | Ht 66.5 in | Wt 188.0 lb

## 2023-01-18 DIAGNOSIS — Z0000 Encounter for general adult medical examination without abnormal findings: Secondary | ICD-10-CM

## 2023-01-18 DIAGNOSIS — I10 Essential (primary) hypertension: Secondary | ICD-10-CM | POA: Diagnosis not present

## 2023-01-18 DIAGNOSIS — E7849 Other hyperlipidemia: Secondary | ICD-10-CM | POA: Diagnosis not present

## 2023-01-20 LAB — LIPID PANEL
Chol/HDL Ratio: 2.4 ratio (ref 0.0–4.4)
Cholesterol, Total: 215 mg/dL — ABNORMAL HIGH (ref 100–199)
HDL: 88 mg/dL (ref >39)
LDL Chol Calc (NIH): 119 mg/dL — ABNORMAL HIGH (ref 0–99)
Triglycerides: 44 mg/dL (ref 0–149)
VLDL Cholesterol Cal: 8 mg/dL (ref 5–40)

## 2023-01-20 LAB — BASIC METABOLIC PANEL
BUN/Creatinine Ratio: 12 (ref 12–28)
BUN: 14 mg/dL (ref 8–27)
CO2: 23 mmol/L (ref 20–29)
Calcium: 9.9 mg/dL (ref 8.7–10.3)
Chloride: 104 mmol/L (ref 96–106)
Creatinine, Ser: 1.17 mg/dL — ABNORMAL HIGH (ref 0.57–1.00)
Glucose: 96 mg/dL (ref 70–99)
Potassium: 4 mmol/L (ref 3.5–5.2)
Sodium: 141 mmol/L (ref 134–144)
eGFR: 50 mL/min/{1.73_m2} — ABNORMAL LOW (ref >59)

## 2023-01-20 LAB — MICROALBUMIN / CREATININE URINE RATIO
Creatinine, Urine: 175.5 mg/dL
Microalb/Creat Ratio: 8 mg/g creat (ref 0–29)
Microalbumin, Urine: 14 ug/mL

## 2023-01-20 LAB — HEPATIC FUNCTION PANEL
ALT: 27 IU/L (ref 0–32)
AST: 33 IU/L (ref 0–40)
Albumin: 4.8 g/dL (ref 3.8–4.8)
Alkaline Phosphatase: 98 IU/L (ref 44–121)
Bilirubin Total: 0.4 mg/dL (ref 0.0–1.2)
Bilirubin, Direct: 0.15 mg/dL (ref 0.00–0.40)
Total Protein: 7.5 g/dL (ref 6.0–8.5)

## 2023-01-23 ENCOUNTER — Ambulatory Visit (INDEPENDENT_AMBULATORY_CARE_PROVIDER_SITE_OTHER): Payer: Medicare Other | Admitting: Family Medicine

## 2023-01-23 VITALS — BP 142/80 | HR 91 | Wt 188.8 lb

## 2023-01-23 DIAGNOSIS — I10 Essential (primary) hypertension: Secondary | ICD-10-CM

## 2023-01-23 MED ORDER — POTASSIUM CHLORIDE CRYS ER 10 MEQ PO TBCR: 10.0000 meq | EXTENDED_RELEASE_TABLET | Freq: Two times a day (BID) | ORAL | 1 refills | Status: DC

## 2023-01-23 MED ORDER — HYDROCHLOROTHIAZIDE 12.5 MG PO CAPS: 12.5000 mg | ORAL_CAPSULE | Freq: Every day | ORAL | 1 refills | Status: DC

## 2023-01-23 NOTE — Progress Notes (Signed)
   Subjective:    Patient ID: Alicia Wu, female    DOB: 12-Jun-1951, 72 y.o.   MRN: [id number]  HPI Patient arrives today for 6 week follow up for blood pressure. Patient for blood pressure check up.  The patient does have hypertension.   Patient relates dietary measures try to minimize salt The importance of healthy diet and activity were discussed Patient relates compliance  Blood pressure not under good control.  She is walking on a regular basis.  Denies any chest tightness pressure pain shortness of breath.  He is trying to minimize salt in her diet.  Review of Systems     Objective:   Physical Exam General-in no acute distress Eyes-no discharge Lungs-respiratory rate normal, CTA CV-no murmurs,RRR Extremities skin warm dry no edema Neuro grossly normal Behavior normal, alert Patient to do her lab work in approximately 10 days  Gfds                 Assessment & Plan:   HTN Subpar control Add HCTZ 12.5 along with low-dose potassium healthy diet minimize salt regular physical activity.  Follow-up again in 6 weeks time recheck blood pressure

## 2023-02-04 DIAGNOSIS — I10 Essential (primary) hypertension: Secondary | ICD-10-CM | POA: Diagnosis not present

## 2023-02-05 ENCOUNTER — Encounter: Payer: Self-pay | Admitting: Family Medicine

## 2023-02-05 LAB — BASIC METABOLIC PANEL (7)
BUN/Creatinine Ratio: 7 — ABNORMAL LOW (ref 12–28)
BUN: 7 mg/dL — ABNORMAL LOW (ref 8–27)
CO2: 22 mmol/L (ref 20–29)
Chloride: 103 mmol/L (ref 96–106)
Creatinine, Ser: 0.95 mg/dL (ref 0.57–1.00)
Glucose: 86 mg/dL (ref 70–99)
Potassium: 4 mmol/L (ref 3.5–5.2)
Sodium: 140 mmol/L (ref 134–144)
eGFR: 64 mL/min/{1.73_m2} (ref >59)

## 2023-03-12 ENCOUNTER — Encounter: Payer: Self-pay | Admitting: Family Medicine

## 2023-03-12 ENCOUNTER — Ambulatory Visit (INDEPENDENT_AMBULATORY_CARE_PROVIDER_SITE_OTHER): Payer: Medicare Other | Admitting: Family Medicine

## 2023-03-12 VITALS — BP 124/82 | HR 87 | Ht 66.5 in | Wt 189.6 lb

## 2023-03-12 DIAGNOSIS — I10 Essential (primary) hypertension: Secondary | ICD-10-CM | POA: Diagnosis not present

## 2023-03-12 NOTE — Progress Notes (Signed)
   Subjective:    Patient ID: Alicia Wu, female    DOB: 23-Aug-1951, 71 y.o.   MRN: [id number]  HPI Patient arrives today for 6 week follow up HTN.  Patient tolerating the medicine well Fitting and exercise Trying to watch her diet Trying to lose some weight We did discuss strategies to help   Review of Systems     Objective:   Physical Exam  General-in no acute distress Eyes-no discharge Lungs-respiratory rate normal, CTA CV-no murmurs,RRR Extremities skin warm dry no edema Neuro grossly normal Behavior normal, alert       Assessment & Plan:  Blood pressure recheck very good No additional labs necessary today Follow-up lab work in the fall with a follow-up office visit in approximately 5 months

## 2023-03-26 ENCOUNTER — Other Ambulatory Visit: Payer: Self-pay | Admitting: Family Medicine

## 2023-04-09 ENCOUNTER — Other Ambulatory Visit: Payer: Self-pay | Admitting: Family Medicine

## 2023-06-03 ENCOUNTER — Other Ambulatory Visit: Payer: Self-pay | Admitting: Family Medicine

## 2023-06-20 ENCOUNTER — Other Ambulatory Visit (HOSPITAL_COMMUNITY): Payer: Self-pay | Admitting: Family Medicine

## 2023-06-20 DIAGNOSIS — Z1231 Encounter for screening mammogram for malignant neoplasm of breast: Secondary | ICD-10-CM

## 2023-08-07 ENCOUNTER — Encounter (HOSPITAL_COMMUNITY): Payer: Self-pay

## 2023-08-07 ENCOUNTER — Ambulatory Visit (HOSPITAL_COMMUNITY)
Admission: RE | Admit: 2023-08-07 | Discharge: 2023-08-07 | Disposition: A | Discharge status: Home / Self Care | Payer: Medicare Other | Source: Ambulatory Visit | Attending: Family Medicine | Admitting: Family Medicine

## 2023-08-07 DIAGNOSIS — Z1231 Encounter for screening mammogram for malignant neoplasm of breast: Secondary | ICD-10-CM | POA: Diagnosis not present

## 2023-08-12 ENCOUNTER — Telehealth: Payer: Self-pay

## 2023-08-12 ENCOUNTER — Other Ambulatory Visit: Payer: Self-pay

## 2023-08-12 MED ORDER — POTASSIUM CHLORIDE CRYS ER 10 MEQ PO TBCR: 10.0000 meq | EXTENDED_RELEASE_TABLET | Freq: Two times a day (BID) | ORAL | 3 refills | Status: DC

## 2023-08-12 NOTE — Telephone Encounter (Signed)
Prescription Request  08/12/2023  LOV: Visit date not found  What is the name of the medication or equipment? potassium chloride (KLOR-CON M) 10 MEQ tablet   Have you contacted your pharmacy to request a refill? Yes   Which pharmacy would you like this sent to?   [facility]   Patient notified that their request is being sent to the clinical staff for review and that they should receive a response within 2 business days.   Please advise at Mobile There is no such number on file (mobile).

## 2023-08-13 ENCOUNTER — Ambulatory Visit: Payer: Medicare Other | Admitting: Family Medicine

## 2023-08-20 ENCOUNTER — Ambulatory Visit (INDEPENDENT_AMBULATORY_CARE_PROVIDER_SITE_OTHER): Payer: Medicare Other | Admitting: Family Medicine

## 2023-08-20 VITALS — BP 135/87 | HR 89 | Temp 98.4°F | Ht 66.5 in | Wt 192.8 lb

## 2023-08-20 DIAGNOSIS — Z79899 Other long term (current) drug therapy: Secondary | ICD-10-CM

## 2023-08-20 DIAGNOSIS — I10 Essential (primary) hypertension: Secondary | ICD-10-CM | POA: Diagnosis not present

## 2023-08-20 DIAGNOSIS — E7849 Other hyperlipidemia: Secondary | ICD-10-CM | POA: Diagnosis not present

## 2023-08-20 NOTE — Progress Notes (Signed)
   Subjective:    Patient ID: Alicia Wu, female    DOB: 05-12-1951, 72 y.o.   MRN: [id number]  Discussed the use of AI scribe software for clinical note transcription with the patient, who gave verbal consent to proceed.  History of Present Illness   The patient, with a history of hypertension and hyperlipidemia, has been maintaining a healthy lifestyle with regular exercise and a balanced diet. She reports walking for an hour daily, having reduced from two hours due to physical discomfort. She has been mindful of her diet, consuming mostly fruits and vegetables with occasional servings of rotisserie chicken and fish. She admits to indulging in ice cream twice a week but ensures portion control.  The patient has been compliant with her amlodipine and cholesterol medication, taking them daily as prescribed. However, she has discontinued the use of hydrochlorothiazide due to adverse reactions, including hives, blisters, and knots.  Her bowel movements are regular, occurring once or twice daily without any noted abnormalities. She has completed her annual health maintenance, including mammogram, flu shot, RSV, and COVID vaccinations.  The patient's mood is generally stable, although she mentions some interpersonal challenges within her church community. She has been managing these issues through prayer and maintaining a low-stress lifestyle.  Despite her efforts, the patient expresses dissatisfaction with her progress, particularly in weight management and blood pressure control. She is open to further lifestyle modifications and is committed to improving her health status.         Review of Systems     Objective:    Physical Exam   VITALS: BP- 136/88, BP- 138/84 CHEST: Lungs clear to auscultation. CARDIOVASCULAR: Heart rhythm regular. EXTREMITIES: No swelling in legs.           Assessment & Plan:  Assessment and Plan    Hypertension Controlled on Amlodipine. No current use of  Hydrochlorothiazide due to previous adverse reaction (hives, blisters, knots). -Continue Amlodipine daily. -Consider alternative antihypertensive if blood pressure remains slightly elevated at next visit.  Hyperlipidemia Adherent to daily cholesterol medication. -Continue current regimen.  Healthy Lifestyle Engages in regular physical activity (walking for 60 minutes daily) and maintains a balanced diet with occasional indulgences (ice cream twice a week, occasional donut or slice of pumpkin pie). -Encouraged to continue current lifestyle habits.  General Health Maintenance Up to date with mammogram, flu shot, RSV vaccine, and COVID-19 vaccine. -Plan to conduct routine blood work prior to next visit in 4-5 months (February-March 2025).      1. Essential hypertension, benign Patient was encouraged to stick with diet stay physically active and do the walking blood pressure reasonable for age - Basic Metabolic Panel - Microalbumin/Creatinine Ratio, Urine  2. Other hyperlipidemia Lipid profile recommended - Lipid Panel  3. High risk medication use Lab work recommend - Hepatic Function Panel  Follow-up within 5 to 6 months

## 2023-09-03 NOTE — Telephone Encounter (Signed)
Error

## 2023-09-11 ENCOUNTER — Ambulatory Visit: Payer: Medicare Other | Admitting: Family Medicine

## 2023-09-17 DIAGNOSIS — H40013 Open angle with borderline findings, low risk, bilateral: Secondary | ICD-10-CM | POA: Diagnosis not present

## 2023-09-18 ENCOUNTER — Ambulatory Visit: Payer: Medicare Other | Admitting: Family Medicine

## 2023-12-21 ENCOUNTER — Other Ambulatory Visit: Payer: Self-pay | Admitting: Family Medicine

## 2023-12-23 ENCOUNTER — Other Ambulatory Visit: Payer: Self-pay

## 2023-12-23 DIAGNOSIS — E7849 Other hyperlipidemia: Secondary | ICD-10-CM

## 2023-12-23 MED ORDER — PRAVASTATIN SODIUM 40 MG PO TABS: ORAL_TABLET | ORAL | 2 refills | Status: DC

## 2023-12-31 NOTE — Telephone Encounter (Unsigned)
Copied from CRM [id number]. Topic: Clinical - Request for Lab/Test Order >> Dec 31, 2023  9:35 AM Gildardo Pounds wrote: Reason for CRM: Patient needs orders for labs before appointment on 01/23/2024. Callback number [phone]

## 2024-01-14 DIAGNOSIS — Z79899 Other long term (current) drug therapy: Secondary | ICD-10-CM | POA: Diagnosis not present

## 2024-01-14 DIAGNOSIS — I10 Essential (primary) hypertension: Secondary | ICD-10-CM | POA: Diagnosis not present

## 2024-01-14 DIAGNOSIS — E7849 Other hyperlipidemia: Secondary | ICD-10-CM | POA: Diagnosis not present

## 2024-01-16 LAB — LIPID PANEL
Chol/HDL Ratio: 2.2 {ratio} (ref 0.0–4.4)
Cholesterol, Total: 197 mg/dL (ref 100–199)
HDL: 91 mg/dL (ref >39)
LDL Chol Calc (NIH): 96 mg/dL (ref 0–99)
Triglycerides: 51 mg/dL (ref 0–149)
VLDL Cholesterol Cal: 10 mg/dL (ref 5–40)

## 2024-01-16 LAB — BASIC METABOLIC PANEL
BUN/Creatinine Ratio: 14 (ref 12–28)
BUN: 14 mg/dL (ref 8–27)
CO2: 23 mmol/L (ref 20–29)
Calcium: 9.8 mg/dL (ref 8.7–10.3)
Chloride: 104 mmol/L (ref 96–106)
Creatinine, Ser: 1.02 mg/dL — ABNORMAL HIGH (ref 0.57–1.00)
Glucose: 97 mg/dL (ref 70–99)
Potassium: 4.1 mmol/L (ref 3.5–5.2)
Sodium: 142 mmol/L (ref 134–144)
eGFR: 58 mL/min/{1.73_m2} — ABNORMAL LOW (ref >59)

## 2024-01-16 LAB — HEPATIC FUNCTION PANEL
ALT: 26 [IU]/L (ref 0–32)
AST: 31 [IU]/L (ref 0–40)
Albumin: 4.6 g/dL (ref 3.8–4.8)
Alkaline Phosphatase: 100 [IU]/L (ref 44–121)
Bilirubin Total: 0.4 mg/dL (ref 0.0–1.2)
Bilirubin, Direct: 0.17 mg/dL (ref 0.00–0.40)
Total Protein: 7.5 g/dL (ref 6.0–8.5)

## 2024-01-16 LAB — MICROALBUMIN / CREATININE URINE RATIO
Creatinine, Urine: 188.9 mg/dL
Microalb/Creat Ratio: 7 mg/g{creat} (ref 0–29)
Microalbumin, Urine: 13.8 ug/mL

## 2024-01-21 ENCOUNTER — Ambulatory Visit: Payer: Medicare Other | Admitting: Family Medicine

## 2024-01-23 ENCOUNTER — Ambulatory Visit: Payer: Medicare Other | Admitting: Family Medicine

## 2024-01-23 VITALS — BP 132/86 | HR 89 | Temp 97.9°F | Ht 66.0 in | Wt 196.8 lb

## 2024-01-23 DIAGNOSIS — I10 Essential (primary) hypertension: Secondary | ICD-10-CM

## 2024-01-23 DIAGNOSIS — R82998 Other abnormal findings in urine: Secondary | ICD-10-CM

## 2024-01-23 DIAGNOSIS — E7849 Other hyperlipidemia: Secondary | ICD-10-CM

## 2024-01-23 MED ORDER — AMLODIPINE BESYLATE 10 MG PO TABS: 10.0000 mg | ORAL_TABLET | Freq: Every day | ORAL | 2 refills | Status: DC

## 2024-01-23 MED ORDER — POTASSIUM CHLORIDE CRYS ER 10 MEQ PO TBCR: 10.0000 meq | EXTENDED_RELEASE_TABLET | Freq: Two times a day (BID) | ORAL | 3 refills | Status: DC

## 2024-01-23 MED ORDER — PRAVASTATIN SODIUM 40 MG PO TABS: ORAL_TABLET | ORAL | 2 refills | Status: AC

## 2024-01-23 NOTE — Progress Notes (Signed)
 Subjective:    Patient ID: Alicia Wu, female    DOB: 21-Jul-1951, 73 y.o.   MRN: [id number]  Discussed the use of AI scribe software for clinical note transcription with the patient, who gave verbal consent to proceed.  History of Present Illness   Alicia Wu is a 73 year old female who presents for routine follow-up and lab review.  She has experienced a recent weight increase from 192 to 196 pounds, attributed to dietary changes, including increased consumption of peanut butter, cheese, almonds, and cashews. Physical activity decreased due to cold weather, but she has recently resumed walking.  During her recent blood work, she had difficulty providing a urine sample due to frequent urination before leaving home. The urine she managed to provide was brownish, which she attributes to concentration. Her urine appears normal now. No blood in bowel movements and regular bowel movements are reported.  Her recent blood work showed good liver enzyme levels, kidney function, and electrolyte balance. Her cholesterol levels have improved, with LDL cholesterol decreasing from 119 to 96, and HDL cholesterol remaining high at 91.  She is consistent with her medications, including amlodipine, potassium, and pravastatin, but reports issues with her eye drops, specifically Opt[facility], and plans to address this with her pharmacy.  She has been less active socially due to concerns about unhealthy food and virus exposure at church events. She misses Sunday school, which was discontinued by the new pastor. She maintains social connections with neighbors who check on her well-being.       Patient for blood pressure check up.  The patient does have hypertension.   Patient relates dietary measures try to minimize salt The importance of healthy diet and activity were discussed Patient relates compliance  Healthy eating and regular physical activity discussed to help keep weight under control  Review of  Systems     Objective:    Physical Exam   VITALS: BP- 136/82 MEASUREMENTS: Weight- 196. EXTREMITIES: No edema in extremities.     General-in no acute distress Eyes-no discharge Lungs-respiratory rate normal, CTA CV-no murmurs,RRR Extremities skin warm dry no edema Neuro grossly normal Behavior normal, alert  Results for orders placed or performed in visit on 08/20/23  Basic Metabolic Panel   Collection Time: 01/14/24 11:00 AM  Result Value Ref Range   Glucose 97 70 - 99 mg/dL   BUN 14 8 - 27 mg/dL   Creatinine, Ser 4.13 (H) 0.57 - 1.00 mg/dL   eGFR 58 (L) >24 MW/NUU/7.25   BUN/Creatinine Ratio 14 12 - 28   Sodium 142 134 - 144 mmol/L   Potassium 4.1 3.5 - 5.2 mmol/L   Chloride 104 96 - 106 mmol/L   CO2 23 20 - 29 mmol/L   Calcium 9.8 8.7 - 10.3 mg/dL  Lipid Panel   Collection Time: 01/14/24 11:00 AM  Result Value Ref Range   Cholesterol, Total 197 100 - 199 mg/dL   Triglycerides 51 0 - 149 mg/dL   HDL 91 >36 mg/dL   VLDL Cholesterol Cal 10 5 - 40 mg/dL   LDL Chol Calc (NIH) 96 0 - 99 mg/dL   Chol/HDL Ratio 2.2 0.0 - 4.4 ratio  Hepatic Function Panel   Collection Time: 01/14/24 11:00 AM  Result Value Ref Range   Total Protein 7.5 6.0 - 8.5 g/dL   Albumin 4.6 3.8 - 4.8 g/dL   Bilirubin Total 0.4 0.0 - 1.2 mg/dL   Bilirubin, Direct 6.44 0.00 - 0.40 mg/dL  Alkaline Phosphatase 100 44 - 121 IU/L   AST 31 0 - 40 IU/L   ALT 26 0 - 32 IU/L  Microalbumin/Creatinine Ratio, Urine   Collection Time: 01/14/24 11:00 AM  Result Value Ref Range   Creatinine, Urine 188.9 Not Estab. mg/dL   Microalbumin, Urine 11.9 Not Estab. ug/mL   Microalb/Creat Ratio 7 0 - 29 mg/g creat         Assessment & Plan:  Assessment and Plan    Brownish Urine Brownish urine likely due to dehydration. Normal protein test indicates no kidney concern. She declined microscopic examination. - Offer urine specimen collection for microscopic examination at a later date if  desired.  Hypertension Blood pressure at 136/82, within acceptable range. Potential improvement with increased physical activity and weight loss. - Encourage increased physical activity and weight management.  Hyperlipidemia Cholesterol levels improved; LDL decreased to 96, below target. HDL excellent at 91. Emphasized maintaining healthy diet and exercise. - Continue current cholesterol management plan. - Encourage healthy diet and regular exercise.  Medication Management Issues with eye drops from [facility]; [facility] Consistent with amlodipine, potassium, and pravastatin from [facility] unless issues arise. - Continue amlodipine, potassium, and pravastatin from [facility]. - [facility]  General Health Maintenance Colonoscopy due in 2026; if normal, further screenings may not be necessary after age 53. Discussed mammogram frequency and stress management. - Schedule colonoscopy in 2026. - Continue regular mammograms, consider reducing frequency to every other year in the 80s. - Encourage stress management and mental health awareness.     Patient states he will come back next week to submit a urine so we can do further evaluation of it  Follow-up 6 months Labs reviewed in detail GFR is in the 50s but previously was normal will monitor.

## 2024-01-24 ENCOUNTER — Ambulatory Visit (INDEPENDENT_AMBULATORY_CARE_PROVIDER_SITE_OTHER): Payer: Medicare Other

## 2024-01-24 VITALS — Ht 66.0 in | Wt 196.0 lb

## 2024-01-24 DIAGNOSIS — Z780 Asymptomatic menopausal state: Secondary | ICD-10-CM

## 2024-01-24 DIAGNOSIS — Z0000 Encounter for general adult medical examination without abnormal findings: Secondary | ICD-10-CM

## 2024-01-24 NOTE — Patient Instructions (Signed)
 [name] , Thank you for taking time to come for your Medicare Wellness Visit. I appreciate your ongoing commitment to your health goals. Please review the following plan we discussed and let me know if I can assist you in the future.   Referrals/Orders/Follow-Ups/Clinician Recommendations:  Next Medicare Annual Wellness Visit:   January 29, 2025 at 1:00 pm telephone visit  Your Bone Density scan is on January 31, 2024 at 9:00 am wear 2 piece comfortable clothing  This is a list of the screening recommended for you and due dates:  Health Maintenance  Topic Date Due   DEXA scan (bone density measurement)  05/03/2023   COVID-19 Vaccine (5 - 2024-25 season) 07/21/2023   Flu Shot  02/17/2024*   Mammogram  08/06/2024   Medicare Annual Wellness Visit  01/23/2025   Colon Cancer Screening  06/02/2025   DTaP/Tdap/Td vaccine (2 - Td or Tdap) 01/24/2032   Pneumonia Vaccine  Completed   Hepatitis C Screening  Completed   Zoster (Shingles) Vaccine  Completed   HPV Vaccine  Aged Out  *Topic was postponed. The date shown is not the original due date.    Advanced directives: (Declined) Advance directive discussed with you today. Even though you declined this today, please call our office should you change your mind, and we can give you the proper paperwork for you to fill out.  Next Medicare Annual Wellness Visit scheduled for next year: yes  Understanding Your Risk for Falls Millions of people have serious injuries from falls each year. It is important to understand your risk of falling. Talk with your health care provider about your risk and what you can do to lower it. If you do have a serious fall, make sure to tell your provider. Falling once raises your risk of falling again. How can falls affect me? Serious injuries from falls are common. These include: Broken bones, such as hip fractures. Head injuries, such as traumatic brain injuries (TBI) or concussions. A fear of falling can cause you  to avoid activities and stay at home. This can make your muscles weaker and raise your risk for a fall. What can increase my risk? There are a number of risk factors that increase your risk for falling. The more risk factors you have, the higher your risk of falling. Serious injuries from a fall happen most often to people who are older than 74 years old. Teenagers and young adults ages 29-29 are also at higher risk. Common risk factors include: Weakness in the lower body. Being generally weak or confused due to long-term (chronic) illness. Dizziness or balance problems. Poor vision. Medicines that cause dizziness or drowsiness. These may include: Medicines for your blood pressure, heart, anxiety, insomnia, or swelling (edema). Pain medicines. Muscle relaxants. Other risk factors include: Drinking alcohol. Having had a fall in the past. Having foot pain or wearing improper footwear. Working at a dangerous job. Having any of the following in your home: Tripping hazards, such as floor clutter or loose rugs. Poor lighting. Pets. Having dementia or memory loss. What actions can I take to lower my risk of falling?     Physical activity Stay physically fit. Do strength and balance exercises. Consider taking a regular class to build strength and balance. Yoga and tai chi are good options. Vision Have your eyes checked every year and your prescription for glasses or contacts updated as needed. Shoes and walking aids Wear non-skid shoes. Wear shoes that have rubber soles and low heels. Do  not wear high heels. Do not walk around the house in socks or slippers. Use a cane or walker as told by your provider. Home safety Attach secure railings on both sides of your stairs. Install grab bars for your bathtub, shower, and toilet. Use a non-skid mat in your bathtub or shower. Attach bath mats securely with double-sided, non-slip rug tape. Use good lighting in all rooms. Keep a flashlight near  your bed. Make sure there is a clear path from your bed to the bathroom. Use night-lights. Do not use throw rugs. Make sure all carpeting is taped or tacked down securely. Remove all clutter from walkways and stairways, including extension cords. Repair uneven or broken steps and floors. Avoid walking on icy or slippery surfaces. Walk on the grass instead of on icy or slick sidewalks. Use ice melter to get rid of ice on walkways in the winter. Use a cordless phone. Questions to ask your health care provider Can you help me check my risk for a fall? Do any of my medicines make me more likely to fall? Should I take a vitamin D supplement? What exercises can I do to improve my strength and balance? Should I make an appointment to have my vision checked? Do I need a bone density test to check for weak bones (osteoporosis)? Would it help to use a cane or a walker? Where to find more information Centers for Disease Control and Prevention, STEADI: TonerPromos.no Community-Based Fall Prevention Programs: TonerPromos.no [employer] on Aging: BaseRingTones.pl Contact a health care provider if: You fall at home. You are afraid of falling at home. You feel weak, drowsy, or dizzy. This information is not intended to replace advice given to you by your health care provider. Make sure you discuss any questions you have with your health care provider. Document Revised: 07/09/2022 Document Reviewed: 07/09/2022 Elsevier Patient Education  2024 [employer].

## 2024-01-24 NOTE — Progress Notes (Signed)
 Because this visit was a virtual/telehealth visit,  certain criteria was not obtained, such a blood pressure, CBG if applicable, and timed get up and go. Any medications not marked as "taking" were not mentioned during the medication reconciliation part of the visit. Any vitals not documented were not able to be obtained due to this being a telehealth visit or patient was unable to self-report a recent blood pressure reading due to a lack of equipment at home via telehealth. Vitals that have been documented are verbally provided by the patient.   Subjective:   Alicia Wu is a 73 y.o. who presents for a Medicare Wellness preventive visit.  Visit Complete: Virtual I connected with  Alicia Wu on 01/24/24 by a audio enabled telemedicine application and verified that I am speaking with the correct person using two identifiers.  Patient Location: Home  Provider Location: Home Office  I discussed the limitations of evaluation and management by telemedicine. The patient expressed understanding and agreed to proceed.  Vital Signs: Because this visit was a virtual/telehealth visit, some criteria may be missing or patient reported. Any vitals not documented were not able to be obtained and vitals that have been documented are patient reported.  VideoDeclined- This patient declined Librarian, academic. Therefore the visit was completed with audio only.  AWV Questionnaire: No: Patient Medicare AWV questionnaire was not completed prior to this visit.  Cardiac Risk Factors include: advanced age (>29men, >44 women);obesity (BMI >30kg/m2);sedentary lifestyle;hypertension;dyslipidemia     Objective:    Today's Vitals   01/24/24 1320  Weight: 196 lb (88.9 kg)  Height: 5\' 6"  (1.676 m)   Body mass index is 31.64 kg/m.     01/24/2024    1:21 PM 01/18/2023   10:22 AM 01/09/2022   10:45 AM 06/03/2015   12:36 PM  Advanced Directives  Does Patient Have a Medical Advance  Directive? Yes Yes No No  Type of Estate agent of [place];Living will Living will;Healthcare Power of Attorney    Does patient want to make changes to medical advance directive? No - Patient declined No - Patient declined    Copy of Healthcare Power of Attorney in Chart? Yes - validated most recent copy scanned in chart (See row information) Yes - validated most recent copy scanned in chart (See row information)    Would patient like information on creating a medical advance directive?   No - Patient declined No - patient declined information    Current Medications (verified) Outpatient Encounter Medications as of 01/24/2024  Medication Sig   amLODipine (NORVASC) 10 MG tablet Take 1 tablet (10 mg total) by mouth daily.   latanoprost (XALATAN) 0.005 % ophthalmic solution INSTILL 1 DROP INTO INTO BOTH EYES AT BEDTIME   Multiple Vitamins-Minerals (ONE-A-DAY 50 PLUS) TABS Take by mouth.   potassium chloride (KLOR-CON M) 10 MEQ tablet Take 1 tablet (10 mEq total) by mouth 2 (two) times daily.   pravastatin (PRAVACHOL) 40 MG tablet TAKE 1 TABLET BY MOUTH  DAILY IN THE EVENING   No facility-administered encounter medications on file as of 01/24/2024.    Allergies (verified) Lisinopril and Sulfa antibiotics   History: Past Medical History:  Diagnosis Date   Glaucoma    Hyperlipidemia    Hypertension    Past Surgical History:  Procedure Laterality Date   ABDOMINAL HYSTERECTOMY     COLONOSCOPY  2006   COLONOSCOPY N/A 06/03/2015   Procedure: COLONOSCOPY;  Surgeon: Corbin Ade, MD;  [facility]  [facility];  Service: Endoscopy;  Laterality: N/A;  11:30 AM   History reviewed. No pertinent family history. Social History   Socioeconomic History   Marital status: Widowed    Spouse name: Not on file   Number of children: Not on file   Years of education: Not on file   Highest education level: Not on file  Occupational History   Not on file  Tobacco Use   Smoking  status: Former    Current packs/day: 0.00    Types: Cigarettes    Quit date: 03/13/1994    Years since quitting: 29.8   Smokeless tobacco: Never  Vaping Use   Vaping status: Never Used  Substance and Sexual Activity   Alcohol use: No   Drug use: No   Sexual activity: Not Currently  Other Topics Concern   Not on file  Social History Narrative   Not on file   Social Drivers of Health   Financial Resource Strain: Low Risk  (01/24/2024)   Overall Financial Resource Strain (CARDIA)    Difficulty of Paying Living Expenses: Not hard at all  Food Insecurity: No Food Insecurity (01/24/2024)   Hunger Vital Sign    Worried About Running Out of Food in the Last Year: Never true    Ran Out of Food in the Last Year: Never true  Transportation Needs: No Transportation Needs (01/24/2024)   PRAPARE - Administrator, Civil Service (Medical): No    Lack of Transportation (Non-Medical): No  Physical Activity: Sufficiently Active (01/24/2024)   Exercise Vital Sign    Days of Exercise per Week: 7 days    Minutes of Exercise per Session: 30 min  Stress: No Stress Concern Present (01/24/2024)   [employer] of Occupational Health - Occupational Stress Questionnaire    Feeling of Stress : Not at all  Social Connections: Moderately Isolated (01/24/2024)   Social Connection and Isolation Panel [NHANES]    Frequency of Communication with Friends and Family: More than three times a week    Frequency of Social Gatherings with Friends and Family: More than three times a week    Attends Religious Services: More than 4 times per year    Active Member of [employer] or Organizations: No    Attends Banker Meetings: Never    Marital Status: Widowed    Tobacco Counseling Counseling given: Not Answered    Clinical Intake:  Pre-visit preparation completed: Yes  Pain : No/denies pain     BMI - recorded: 31.64 Nutritional Status: BMI > 30  Obese Nutritional Risks: None Diabetes:  No  How often do you need to have someone help you when you read instructions, pamphlets, or other written materials from your doctor or pharmacy?: 1 - Never  Interpreter Needed?: No  Information entered by :: Maryjean Ka CMA   Activities of Daily Living      01/24/2024    1:22 PM  In your present state of health, do you have any difficulty performing the following activities:  Hearing? 0  Vision? 0  Difficulty concentrating or making decisions? 0  Walking or climbing stairs? 0  Dressing or bathing? 0  Doing errands, shopping? 0  Preparing Food and eating ? N  Using the Toilet? N  In the past six months, have you accidently leaked urine? N  Do you have problems with loss of bowel control? N  Managing your Medications? N  Managing your Finances? N  Housekeeping or managing your Housekeeping?  N    Patient Care Team: Babs Sciara, MD as PCP - General (Family Medicine) Daisy Lazar, DO (Optometry)  Indicate any recent Medical Services you may have received from other than [facility] providers in the past year (date may be approximate).     Assessment:   This is a routine wellness examination for Alicia Wu.  Hearing/Vision screen Hearing Screening - Comments:: Patient denies any hearing difficulties.   Vision Screening - Comments:: Wears rx glasses - up to date with routine eye exams  Patient sees Dr. Daisy Lazar w/ [facility] [place] office.     Goals Addressed             This Visit's Progress    Patient Stated       Continue to remain mentally sound and live the life God would want me to live.        Depression Screen     01/24/2024    1:26 PM 01/23/2024    8:42 AM 08/20/2023    3:55 PM 03/12/2023   10:59 AM 01/23/2023    1:37 PM 01/18/2023   11:30 AM 12/12/2022   11:10 AM  PHQ 2/9 Scores  PHQ - 2 Score 0 0 0 3 0 0 0  PHQ- 9 Score 0 0 0 3  0 0    Fall Risk     01/24/2024    1:21 PM 01/23/2024    8:42 AM 08/20/2023    3:55 PM 03/12/2023   10:59 AM 01/23/2023    1:36  PM  Fall Risk   Falls in the past year? 0 0 0 0 0  Number falls in past yr: 0   0 0  Injury with Fall? 0   0 0  Risk for fall due to : No Fall Risks      Follow up Falls prevention discussed;Falls evaluation completed        MEDICARE RISK AT HOME:  Medicare Risk at Home Any stairs in or around the home?: Yes If so, are there any without handrails?: No Home free of loose throw rugs in walkways, pet beds, electrical cords, etc?: Yes Adequate lighting in your home to reduce risk of falls?: Yes Life alert?: No Use of a cane, walker or w/c?: No Grab bars in the bathroom?: No Shower chair or bench in shower?: No Elevated toilet seat or a handicapped toilet?: No  TIMED UP AND GO:  Was the test performed?  No  Cognitive Function: 6CIT completed        01/24/2024    1:21 PM 01/18/2023   10:23 AM 01/09/2022   10:52 AM  6CIT Screen  What Year? 0 points 0 points 0 points  What month? 0 points 0 points 0 points  What time? 0 points 0 points 0 points  Count back from 20 0 points 0 points 0 points  Months in reverse 0 points 0 points 0 points  Repeat phrase 0 points 0 points 2 points  Total Score 0 points 0 points 2 points    Immunizations Immunization History  Administered Date(s) Administered   Fluad Quad(high Dose 65+) 08/01/2021   Influenza, High Dose Seasonal PF 08/28/2017, 08/25/2018, 08/22/2019   Influenza-Unspecified 08/24/2014, 08/03/2015, 08/16/2015, 08/28/2016, 08/25/2018, 08/03/2020, 08/10/2022   Moderna Covid-19 Fall Seasonal Vaccine 69yrs & older 09/05/2022   Moderna Sars-Covid-2 Vaccination 01/21/2020, 02/23/2020, 09/24/2020   Pneumococcal Conjugate-13 09/02/2015, 08/25/2018   Pneumococcal Polysaccharide-23 09/06/2016   Tdap 01/23/2022   Zoster Recombinant(Shingrix) 01/09/2022, 07/31/2022   Zoster,  Live 10/04/2015    Screening Tests Health Maintenance  Topic Date Due   DEXA SCAN  05/03/2023   COVID-19 Vaccine (5 - 2024-25 season) 07/21/2023   INFLUENZA  VACCINE  02/17/2024 (Originally 06/20/2023)   MAMMOGRAM  08/06/2024   Medicare Annual Wellness (AWV)  01/23/2025   Colonoscopy  06/02/2025   DTaP/Tdap/Td (2 - Td or Tdap) 01/24/2032   Pneumonia Vaccine 66+ Years old  Completed   Hepatitis C Screening  Completed   Zoster Vaccines- Shingrix  Completed   HPV VACCINES  Aged Out    Health Maintenance  Health Maintenance Due  Topic Date Due   DEXA SCAN  05/03/2023   COVID-19 Vaccine (5 - 2024-25 season) 07/21/2023   Health Maintenance Items Addressed: DEXA scheduled  Additional Screening:  Vision Screening: Recommended annual ophthalmology exams for early detection of glaucoma and other disorders of the eye.  Dental Screening: Recommended annual dental exams for proper oral hygiene  Community Resource Referral / Chronic Care Management: CRR required this visit?  No   CCM required this visit?  No     Plan:     I have personally reviewed and noted the following in the patient's chart:   Medical and social history Use of alcohol, tobacco or illicit drugs  Current medications and supplements including opioid prescriptions. Patient is not currently taking opioid prescriptions. Functional ability and status Nutritional status Physical activity Advanced directives List of other physicians Hospitalizations, surgeries, and ER visits in previous 12 months Vitals Screenings to include cognitive, depression, and falls Referrals and appointments  In addition, I have reviewed and discussed with patient certain preventive protocols, quality metrics, and best practice recommendations. A written personalized care plan for preventive services as well as general preventive health recommendations were provided to patient.     Jordan Hawks Venissa Nappi, CMA   01/24/2024   After Visit Summary: (Mail) Due to this being a telephonic visit, the after visit summary with patients personalized plan was offered to patient via mail   Notes: Nothing  significant to report at this time.

## 2024-01-29 ENCOUNTER — Other Ambulatory Visit: Payer: Self-pay

## 2024-01-29 ENCOUNTER — Telehealth: Payer: Self-pay

## 2024-01-29 DIAGNOSIS — R300 Dysuria: Secondary | ICD-10-CM

## 2024-01-29 LAB — POCT URINALYSIS DIP (CLINITEK)
Blood, UA: NEGATIVE
Glucose, UA: NEGATIVE mg/dL
Nitrite, UA: NEGATIVE
Spec Grav, UA: 1.015 (ref 1.010–1.025)
Urobilinogen, UA: 0.2 U/dL
pH, UA: 6 (ref 5.0–8.0)

## 2024-01-29 NOTE — Telephone Encounter (Signed)
 Urine Sample left per Dr Lorin Picket

## 2024-01-31 ENCOUNTER — Ambulatory Visit (HOSPITAL_COMMUNITY)
Admission: RE | Admit: 2024-01-31 | Discharge: 2024-01-31 | Disposition: A | Discharge status: Home / Self Care | Source: Ambulatory Visit | Attending: Family Medicine | Admitting: Family Medicine

## 2024-01-31 DIAGNOSIS — Z780 Asymptomatic menopausal state: Secondary | ICD-10-CM | POA: Insufficient documentation

## 2024-02-02 ENCOUNTER — Encounter: Payer: Self-pay | Admitting: Family Medicine

## 2024-06-29 ENCOUNTER — Other Ambulatory Visit (HOSPITAL_COMMUNITY): Payer: Self-pay | Admitting: Family Medicine

## 2024-06-29 DIAGNOSIS — Z1231 Encounter for screening mammogram for malignant neoplasm of breast: Secondary | ICD-10-CM

## 2024-07-27 ENCOUNTER — Ambulatory Visit (INDEPENDENT_AMBULATORY_CARE_PROVIDER_SITE_OTHER): Admitting: Family Medicine

## 2024-07-27 ENCOUNTER — Encounter: Payer: Self-pay | Admitting: Family Medicine

## 2024-07-27 VITALS — BP 136/84 | HR 80 | Temp 97.7°F | Ht 66.0 in | Wt 197.0 lb

## 2024-07-27 DIAGNOSIS — I10 Essential (primary) hypertension: Secondary | ICD-10-CM

## 2024-07-27 DIAGNOSIS — Z79899 Other long term (current) drug therapy: Secondary | ICD-10-CM | POA: Diagnosis not present

## 2024-07-27 DIAGNOSIS — E7849 Other hyperlipidemia: Secondary | ICD-10-CM

## 2024-07-27 NOTE — Progress Notes (Signed)
   Subjective:    Patient ID: Alicia Wu, female    DOB: 1951-06-26, 73 y.o.   MRN: [id number]  HPI 6 month f/u Hypertension Hyperlipidemia  Alicia Wu is a 73 year old female who presents for a routine follow-up visit.  She maintains a good energy level and has resumed walking daily for 45 minutes over the past couple of weeks. No breathing difficulties, chest tightness, pressure, or pain during these walks.  Her blood pressure was measured at 136/86 mmHg.  Her mood is described as 'very well,' and she maintains social interactions with her church family and biological family, including her daughter, brother, and grandkids. She also mentions a neighbor, Miss Heron, who checks in on her. She enjoys listening to [employer] and jazz, using both radio and CDs, and prefers 'old school' tunes.  She is cautious about her diet, generally eating healthily but occasionally indulging in [place], milk, and Cheetos. She is aware of portion control to avoid overeating. She drives locally but avoids long-distance travel due to traffic concerns. She continues to manage her household tasks, including cooking, without issues.  Her family history includes a brother who is 57 and slowing down, and a sister who did not take her medications. She notes that all her siblings share similar symptoms, and they all engage in walking as a form of exercise  She has been trying to increase her walking recently  Review of Systems     Objective:   Physical Exam General-in no acute distress Eyes-no discharge Lungs-respiratory rate normal, CTA CV-no murmurs,RRR Extremities skin warm dry no edema Neuro grossly normal Behavior normal, alert I do not feel she needs to do lab work currently will do lab work before her next visit in 6 months       Assessment & Plan:  1. Other hyperlipidemia (Primary) Continue current measures check lab work before next visit in 6 months  2. Essential hypertension,  benign Blood pressure good engage in more walking and watch diet follow-up 6 months  3. High risk medication use Labs before next visit Hypertension Blood pressure at 136/86 mmHg, well-controlled with current medication. - Recheck blood pressure. - Continue current antihypertensive regimen.  General Health Maintenance Engages in regular physical activity and maintains a generally healthy diet. Discussed portion control and red meat consumption. - Administer flu shot. - Administer COVID-19 vaccine.  She will get these at her pharmacy

## 2024-08-12 ENCOUNTER — Ambulatory Visit (HOSPITAL_COMMUNITY)
Admission: RE | Admit: 2024-08-12 | Discharge: 2024-08-12 | Disposition: A | Discharge status: Home / Self Care | Source: Ambulatory Visit | Attending: Family Medicine | Admitting: Family Medicine

## 2024-08-12 ENCOUNTER — Encounter (HOSPITAL_COMMUNITY): Payer: Self-pay

## 2024-08-12 DIAGNOSIS — Z1231 Encounter for screening mammogram for malignant neoplasm of breast: Secondary | ICD-10-CM | POA: Diagnosis not present

## 2024-10-20 ENCOUNTER — Other Ambulatory Visit: Payer: Self-pay | Admitting: Family Medicine

## 2024-11-21 ENCOUNTER — Other Ambulatory Visit: Payer: Self-pay | Admitting: Family Medicine

## 2024-11-23 ENCOUNTER — Other Ambulatory Visit: Payer: Self-pay | Admitting: Family Medicine

## 2024-11-23 DIAGNOSIS — E7849 Other hyperlipidemia: Secondary | ICD-10-CM

## 2024-11-23 DIAGNOSIS — I10 Essential (primary) hypertension: Secondary | ICD-10-CM

## 2024-11-23 DIAGNOSIS — Z79899 Other long term (current) drug therapy: Secondary | ICD-10-CM

## 2025-01-29 ENCOUNTER — Ambulatory Visit

## 2025-02-03 ENCOUNTER — Ambulatory Visit: Admitting: Family Medicine
